# Patient Record
Sex: Female | Born: 1965 | Race: Black or African American | Hispanic: No | Marital: Single | Smoking: Current every day smoker
Health system: Southern US, Community
[De-identification: ages and names within clinical notes are randomized; demographics above are authoritative.]

## PROBLEM LIST (undated history)

## (undated) ENCOUNTER — Ambulatory Visit: Admission: EM | Payer: Managed Care, Other (non HMO) | Source: Home / Self Care

## (undated) DIAGNOSIS — I10 Essential (primary) hypertension: Secondary | ICD-10-CM

## (undated) DIAGNOSIS — Z720 Tobacco use: Secondary | ICD-10-CM

## (undated) DIAGNOSIS — K219 Gastro-esophageal reflux disease without esophagitis: Secondary | ICD-10-CM

## (undated) HISTORY — PX: INDUCED ABORTION: SHX677

---

## 2011-10-28 ENCOUNTER — Other Ambulatory Visit: Payer: Self-pay | Admitting: Occupational Medicine

## 2011-10-28 ENCOUNTER — Ambulatory Visit: Payer: Self-pay

## 2011-10-28 DIAGNOSIS — R52 Pain, unspecified: Secondary | ICD-10-CM

## 2012-10-19 ENCOUNTER — Other Ambulatory Visit: Payer: Self-pay | Admitting: Internal Medicine

## 2012-10-19 DIAGNOSIS — Z1231 Encounter for screening mammogram for malignant neoplasm of breast: Secondary | ICD-10-CM

## 2012-11-26 ENCOUNTER — Ambulatory Visit: Payer: Self-pay

## 2012-12-26 ENCOUNTER — Emergency Department (HOSPITAL_COMMUNITY)
Admission: EM | Admit: 2012-12-26 | Discharge: 2012-12-26 | Disposition: A | Discharge status: Home / Self Care | Payer: 59 | Attending: Emergency Medicine | Admitting: Emergency Medicine

## 2012-12-26 ENCOUNTER — Encounter (HOSPITAL_COMMUNITY): Payer: Self-pay | Admitting: Emergency Medicine

## 2012-12-26 ENCOUNTER — Emergency Department (HOSPITAL_COMMUNITY): Payer: 59

## 2012-12-26 DIAGNOSIS — N644 Mastodynia: Secondary | ICD-10-CM | POA: Insufficient documentation

## 2012-12-26 DIAGNOSIS — O200 Threatened abortion: Secondary | ICD-10-CM

## 2012-12-26 DIAGNOSIS — I10 Essential (primary) hypertension: Secondary | ICD-10-CM | POA: Insufficient documentation

## 2012-12-26 DIAGNOSIS — R42 Dizziness and giddiness: Secondary | ICD-10-CM | POA: Insufficient documentation

## 2012-12-26 DIAGNOSIS — Z79899 Other long term (current) drug therapy: Secondary | ICD-10-CM | POA: Insufficient documentation

## 2012-12-26 DIAGNOSIS — Z3202 Encounter for pregnancy test, result negative: Secondary | ICD-10-CM | POA: Insufficient documentation

## 2012-12-26 DIAGNOSIS — R109 Unspecified abdominal pain: Secondary | ICD-10-CM | POA: Insufficient documentation

## 2012-12-26 HISTORY — DX: Essential (primary) hypertension: I10

## 2012-12-26 LAB — BASIC METABOLIC PANEL
BUN: 13 mg/dL (ref 6–23)
CO2: 26 mEq/L (ref 19–32)
Chloride: 100 mEq/L (ref 96–112)
Creatinine, Ser: 0.69 mg/dL (ref 0.50–1.10)
GFR calc Af Amer: >90 mL/min (ref >90)
Glucose, Bld: 78 mg/dL (ref 70–99)
Potassium: 3.6 mEq/L (ref 3.5–5.1)

## 2012-12-26 LAB — CBC
HCT: 39 % (ref 36.0–46.0)
Hemoglobin: 13.3 g/dL (ref 12.0–15.0)
MCH: 32.8 pg (ref 26.0–34.0)
MCHC: 34.1 g/dL (ref 30.0–36.0)
MCV: 96.3 fL (ref 78.0–100.0)

## 2012-12-26 MED ORDER — OXYCODONE-ACETAMINOPHEN 5-325 MG PO TABS: 1.0000 | ORAL_TABLET | ORAL | Status: DC | PRN

## 2012-12-26 MED ORDER — OXYCODONE-ACETAMINOPHEN 5-325 MG PO TABS
1.0000 | ORAL_TABLET | Freq: Once | ORAL | Status: AC
  Administered 2012-12-26: 1 via ORAL
  Filled 2012-12-26: qty 1

## 2012-12-26 NOTE — ED Notes (Signed)
Report awaked with  bright vaginal bleeding with blood clots. Abdominal cramps all day yesterday. Rates pain 10/10 now 8/10. Felt like she was going to faint applied cool towels to face.

## 2012-12-26 NOTE — ED Notes (Signed)
RUE:[id number]<JW> Expected date:12/26/12<BR> Expected time: 8:40 AM<BR> Means of arrival:Ambulance<BR> Comments:<BR> Vag bleed

## 2012-12-26 NOTE — ED Notes (Signed)
Portable  Ultrasound @ bedside.

## 2012-12-26 NOTE — ED Notes (Signed)
After ambulating from bathroom, patient is not tachycardia.

## 2012-12-26 NOTE — ED Provider Notes (Signed)
History     CSN: [id number]  Arrival date & time 12/26/12  7829   First MD Initiated Contact with Patient 12/26/12 [phone]      Chief Complaint  Patient presents with  . Vaginal Bleeding    (Consider location/radiation/quality/duration/timing/severity/associated sxs/prior treatment) Patient is a 47 y.o. female presenting with vaginal bleeding. The history is provided by the patient.  Vaginal Bleeding This is a new problem. The current episode started today. Associated symptoms include abdominal pain. Pertinent negatives include no chills, fever, nausea or vomiting. Associated symptoms comments: She woke this morning with heavy vaginal bleeding, passing clots and having lower abdominal cramping. No N, V. She reports being concerned about miscarriage although pregnancy was not confirmed prior to arrival. She states she has had irregular menses last month and breast tenderness. .    Past Medical History  Diagnosis Date  . Hypertension     No past surgical history on file.  No family history on file.  History  Substance Use Topics  . Smoking status: Not on file  . Smokeless tobacco: Not on file  . Alcohol Use: Yes     Comment: mix drink social    OB History    Grav Para Term Preterm Abortions TAB SAB Ect Mult Living                  Review of Systems  Constitutional: Negative for fever and chills.  Respiratory: Negative.   Cardiovascular: Negative.   Gastrointestinal: Positive for abdominal pain. Negative for nausea and vomiting.  Genitourinary: Positive for vaginal bleeding. Negative for dysuria and vaginal discharge.  Musculoskeletal: Negative.   Skin: Negative.   Neurological: Positive for dizziness.    Allergies  Review of patient's allergies indicates no known allergies.  Home Medications   Current Outpatient Rx  Name  Route  Sig  Dispense  Refill  . ATENOLOL 25 MG PO TABS   Oral   Take 25 mg by mouth daily.         Marland Kitchen HYDROCHLOROTHIAZIDE 25 MG PO TABS   Oral   Take 25 mg by mouth daily.           BP 102/59  Pulse 69  Temp 98.1 F (36.7 C) (Oral)  Resp 20  SpO2 100%  LMP 12/09/2012  Physical Exam  Constitutional: She is oriented to person, place, and time. She appears well-developed and well-nourished.  HENT:  Head: Normocephalic.  Neck: Normal range of motion. Neck supple.  Cardiovascular: Normal rate and regular rhythm.   Pulmonary/Chest: Effort normal and breath sounds normal.  Abdominal: Soft. Bowel sounds are normal. There is no tenderness. There is no rebound and no guarding.  Genitourinary: Vagina normal and uterus normal. No vaginal discharge found.       Significant cervical bleeding with clotting filling vaginal vault. No visualized POC. Uterine tenderness without palpated enlargement, however, exam limited by body habitus.  No adnexal mass or tenderness.  Musculoskeletal: Normal range of motion.  Neurological: She is alert and oriented to person, place, and time.  Skin: Skin is warm and dry. No rash noted.  Psychiatric: She has a normal mood and affect.    ED Course  Procedures (including critical care time)  Labs Reviewed  CBC - Abnormal; Notable for the following:    WBC 11.3 (*)     All other components within normal limits  PREGNANCY, URINE - Abnormal; Notable for the following:    Preg Test, Ur POSITIVE (*)  All other components within normal limits  HCG, QUANTITATIVE, PREGNANCY - Abnormal; Notable for the following:    hCG, Beta Chain, Quant, S 3641 (*)     All other components within normal limits  ABO/RH  BASIC METABOLIC PANEL   Results for orders placed during the hospital encounter of 12/26/12  CBC      Component Value Range   WBC 11.3 (*) 4.0 - 10.5 K/uL   RBC 4.05  3.87 - 5.11 MIL/uL   Hemoglobin 13.3  12.0 - 15.0 g/dL   HCT 16.1  09.6 - 04.5 %   MCV 96.3  78.0 - 100.0 fL   MCH 32.8  26.0 - 34.0 pg   MCHC 34.1  30.0 - 36.0 g/dL   RDW 40.9  81.1 - 91.4 %   Platelets 197  150 - 400  K/uL  PREGNANCY, URINE      Component Value Range   Preg Test, Ur POSITIVE (*) NEGATIVE  ABO/RH      Component Value Range   ABO/RH(D) O POS    HCG, QUANTITATIVE, PREGNANCY      Component Value Range   hCG, Beta Chain, Quant, S 3641 (*) <5 mIU/mL  BASIC METABOLIC PANEL      Component Value Range   Sodium 136  135 - 145 mEq/L   Potassium 3.6  3.5 - 5.1 mEq/L   Chloride 100  96 - 112 mEq/L   CO2 26  19 - 32 mEq/L   Glucose, Bld 78  70 - 99 mg/dL   BUN 13  6 - 23 mg/dL   Creatinine, Ser 7.82  0.50 - 1.10 mg/dL   Calcium 9.4  8.4 - 95.6 mg/dL   GFR calc non Af Amer >90  >90 mL/min   GFR calc Af Amer >90  >90 mL/min   US Ob Comp Less 14 Wks  12/26/2012  *RADIOLOGY REPORT*  Clinical Data: Positive pregnancy test, vaginal bleeding, quantitative beta HCG 3641  OBSTETRIC <14 WK [place] AND TRANSVAGINAL OB US  Technique:  Both transabdominal and transvaginal ultrasound examinations were performed for complete evaluation of the gestation as well as the maternal uterus, adnexal regions, and pelvic cul-de-sac.  Transvaginal technique was performed to assess early pregnancy.  Comparison:  None.  Intrauterine gestational sac:  Not visualized Yolk sac: Not visualized Embryo: Not visualized Cardiac Activity: Not visualized  Maternal uterus/adnexae: Endometrium is inhomogeneously echogenic measuring 1.7 cm.  Ovaries are normal.  On real time exam, mobile fluid/debris was noted within the upper vagina.  IMPRESSION: No intrauterine gestational sac, yolk sac, fetal pole, or cardiac activity visualized. Differential considerations include intrauterine gestation too early to be sonographically visualized, spontaneous abortion, or ectopic pregnancy.  Consider follow-up ultrasound in 10-14 days and serial quantitative beta HCG follow- up.   Original Report Authenticated By: Christiana Pellant, M.D.    US Ob Transvaginal  12/26/2012  *RADIOLOGY REPORT*  Clinical Data: Positive pregnancy test, vaginal bleeding, quantitative  beta HCG 3641  OBSTETRIC <14 WK [place] AND TRANSVAGINAL OB US  Technique:  Both transabdominal and transvaginal ultrasound examinations were performed for complete evaluation of the gestation as well as the maternal uterus, adnexal regions, and pelvic cul-de-sac.  Transvaginal technique was performed to assess early pregnancy.  Comparison:  None.  Intrauterine gestational sac:  Not visualized Yolk sac: Not visualized Embryo: Not visualized Cardiac Activity: Not visualized  Maternal uterus/adnexae: Endometrium is inhomogeneously echogenic measuring 1.7 cm.  Ovaries are normal.  On real time exam, mobile fluid/debris was noted  within the upper vagina.  IMPRESSION: No intrauterine gestational sac, yolk sac, fetal pole, or cardiac activity visualized. Differential considerations include intrauterine gestation too early to be sonographically visualized, spontaneous abortion, or ectopic pregnancy.  Consider follow-up ultrasound in 10-14 days and serial quantitative beta HCG follow- up.   Original Report Authenticated By: Christiana Pellant, M.D.    No results found.   No diagnosis found. 1. Miscarriage   MDM  Pain has been managed. Discussed with Dr. Marice Potter at [facility] who advises 48 hour recheck at [facility] MAU and that the clinic will contact her for follow up.         Arnoldo Hooker, PA-C 12/26/12 1415

## 2012-12-28 ENCOUNTER — Encounter (HOSPITAL_COMMUNITY): Payer: Self-pay

## 2012-12-28 ENCOUNTER — Inpatient Hospital Stay (HOSPITAL_COMMUNITY)
Admission: AD | Admit: 2012-12-28 | Discharge: 2012-12-28 | Disposition: A | Discharge status: Home / Self Care | Payer: 59 | Source: Ambulatory Visit | Attending: Obstetrics & Gynecology | Admitting: Obstetrics & Gynecology

## 2012-12-28 DIAGNOSIS — O039 Complete or unspecified spontaneous abortion without complication: Secondary | ICD-10-CM

## 2012-12-28 NOTE — MAU Provider Note (Signed)
History     CSN: [id number]  Arrival date and time: 12/28/12 4098   None     Chief Complaint  Patient presents with  . Follow-up   HPI 47 y.o. [id number] at [redacted]w[redacted]d by LMP. Pt seen at [facility] on 1/4 with heavy vaginal bleeding and + UPT. Quant was 3400 and no IUP or adnexal mass were seen on U/S at that time. Pt instructed to come to MAU for f/u quant HCG. Continues bleeding like a period and mild cramping.    Past Medical History  Diagnosis Date  . Hypertension     History reviewed. No pertinent past surgical history.  No family history on file.  History  Substance Use Topics  . Smoking status: Not on file  . Smokeless tobacco: Not on file  . Alcohol Use: Yes     Comment: mix drink social    Allergies: No Known Allergies  Prescriptions prior to admission  Medication Sig Dispense Refill  . atenolol (TENORMIN) 25 MG tablet Take 25 mg by mouth daily.      . hydrochlorothiazide (HYDRODIURIL) 25 MG tablet Take 25 mg by mouth daily.      Marland Kitchen oxyCODONE-acetaminophen (PERCOCET/ROXICET) 5-325 MG per tablet Take 1 tablet by mouth every 4 (four) hours as needed for pain.  15 tablet  0    Review of Systems  Constitutional: Negative.   Respiratory: Negative.   Cardiovascular: Negative.   Gastrointestinal: Negative for nausea, vomiting, abdominal pain, diarrhea and constipation.  Genitourinary: Negative for dysuria, urgency, frequency, hematuria and flank pain.       Positive for vaginal bleeding   Musculoskeletal: Negative.   Neurological: Negative.   Psychiatric/Behavioral: Negative.    Physical Exam   Blood pressure 122/68, pulse 79, temperature 99.5 F (37.5 C), temperature source Oral, resp. rate 16, height 5\' 6"  (1.676 m), weight 208 lb 9.6 oz (94.62 kg), last menstrual period 11/09/2012, SpO2 100.00%.  Physical Exam  Nursing note and vitals reviewed. Constitutional: She is oriented to person, place, and time. She appears well-developed and well-nourished. No distress.    Cardiovascular: Normal rate.   Respiratory: Effort normal.  Musculoskeletal: Normal range of motion.  Neurological: She is alert and oriented to person, place, and time.  Skin: Skin is warm and dry.  Psychiatric: She has a normal mood and affect.    MAU Course  Procedures Results for orders placed during the hospital encounter of 12/28/12 (from the past 24 hour(s))  HCG, QUANTITATIVE, PREGNANCY     Status: Abnormal   Collection Time   12/28/12  9:33 AM      Component Value Range   hCG, Beta Chain, Quant, S 486 (*) <5 mIU/mL     Assessment and Plan   1. Miscarriage   appropriate drop in quant c/w miscarriage, precautions rev'd, note given to excuse from work this week per patient request    Medication List     As of 12/28/2012 11:39 AM    CONTINUE taking these medications         atenolol 25 MG tablet   Commonly known as: TENORMIN      hydrochlorothiazide 25 MG tablet   Commonly known as: HYDRODIURIL      oxyCODONE-acetaminophen 5-325 MG per tablet   Commonly known as: PERCOCET/ROXICET   Take 1 tablet by mouth every 4 (four) hours as needed for pain.            Follow-up Information    Follow up with [facility]. In  2 weeks. (for repeat labs)    Contact information:   [address] [place] [address] [phone]           Hridaan Bouse 12/28/2012, 9:34 AM

## 2012-12-28 NOTE — ED Provider Notes (Signed)
Medical screening examination/treatment/procedure(s) were performed by non-physician practitioner and as supervising physician I was immediately available for consultation/collaboration.  Juliet Rude. Rubin Payor, MD 12/28/12 2317

## 2012-12-28 NOTE — MAU Note (Signed)
Patient states she was seen at [facility] on 1-4 and evaluated. Instructed to return to MAU today for repeat BHCG. Patient states she continues to have bleeding like a period and having mild cramping.

## 2012-12-29 ENCOUNTER — Encounter: Payer: Self-pay | Admitting: Obstetrics and Gynecology

## 2013-01-14 ENCOUNTER — Ambulatory Visit (INDEPENDENT_AMBULATORY_CARE_PROVIDER_SITE_OTHER): Payer: 59 | Admitting: Obstetrics and Gynecology

## 2013-01-14 ENCOUNTER — Other Ambulatory Visit: Payer: 59

## 2013-01-14 DIAGNOSIS — O039 Complete or unspecified spontaneous abortion without complication: Secondary | ICD-10-CM

## 2013-01-16 NOTE — Progress Notes (Signed)
   Complete physical exam  Patient: Debbie Ball   DOB: 10/12/1997   47 y.o. Female  MRN: [id number]  Subjective:    No chief complaint on file.   Debbie Ball is a 47 y.o. female who presents today for a complete physical exam. She reports consuming a {diet types:17450} diet. {types:19826} She generally feels {DESC; WELL/FAIRLY WELL/POORLY:18703}. She reports sleeping {DESC; WELL/FAIRLY WELL/POORLY:18703}. She {does/does not:200015} have additional problems to discuss today.    Most recent fall risk assessment:    06/19/2022   10:42 AM  Fall Risk   Falls in the past year? 0  Number falls in past yr: 0  Injury with Fall? 0  Risk for fall due to : No Fall Risks  Follow up Falls evaluation completed     Most recent depression screenings:    06/19/2022   10:42 AM 05/10/2021   10:46 AM  PHQ 2/9 Scores  PHQ - 2 Score 0 0  PHQ- 9 Score 5     {VISON DENTAL STD PSA (Optional):27386}  {History (Optional):23778}  Patient Care Team: Jessup, Joy, NP as PCP - General (Nurse Practitioner)   Outpatient Medications Prior to Visit  Medication Sig   fluticasone (FLONASE) 50 MCG/ACT nasal spray Place 2 sprays into both nostrils in the morning and at bedtime. After 7 days, reduce to once daily.   norgestimate-ethinyl estradiol (SPRINTEC 28) 0.25-35 MG-MCG tablet Take 1 tablet by mouth daily.   Nystatin POWD Apply liberally to affected area 2 times per day   spironolactone (ALDACTONE) 100 MG tablet Take 1 tablet (100 mg total) by mouth daily.   No facility-administered medications prior to visit.    ROS        Objective:     There were no vitals taken for this visit. {Vitals History (Optional):23777}  Physical Exam   No results found for any visits on 07/25/22. {Show previous labs (optional):23779}    Assessment & Plan:    Routine Health Maintenance and Physical Exam  Immunization History  Administered Date(s) Administered   DTaP 12/26/1999, 02/21/2000,  05/01/2000, 01/15/2001, 07/31/2004   Hepatitis A 05/27/2008, 06/02/2009   Hepatitis B 10/13/1999, 11/20/1999, 05/01/2000   HiB (PRP-OMP) 12/26/1999, 02/21/2000, 05/01/2000, 01/15/2001   IPV 12/26/1999, 02/21/2000, 10/20/2000, 07/31/2004   Influenza,inj,Quad PF,6+ Mos 09/02/2014   Influenza-Unspecified 12/02/2012   MMR 10/20/2001, 07/31/2004   Meningococcal Polysaccharide 06/01/2012   Pneumococcal Conjugate-13 01/15/2001   Pneumococcal-Unspecified 05/01/2000, 07/15/2000   Tdap 06/01/2012   Varicella 10/20/2000, 05/27/2008    Health Maintenance  Topic Date Due   HIV Screening  Never done   Hepatitis C Screening  Never done   INFLUENZA VACCINE  07/23/2022   PAP-Cervical Cytology Screening  07/25/2022 (Originally 10/11/2020)   PAP SMEAR-Modifier  07/25/2022 (Originally 10/11/2020)   TETANUS/TDAP  07/25/2022 (Originally 06/01/2022)   HPV VACCINES  Discontinued   COVID-19 Vaccine  Discontinued    Discussed health benefits of physical activity, and encouraged her to engage in regular exercise appropriate for her age and condition.  Problem List Items Addressed This Visit   None Visit Diagnoses     Annual physical exam    -  Primary   Cervical cancer screening       Need for Tdap vaccination          No follow-ups on file.     Joy Jessup, NP   

## 2013-01-27 ENCOUNTER — Ambulatory Visit: Payer: Self-pay | Admitting: Obstetrics & Gynecology

## 2013-02-01 ENCOUNTER — Encounter: Payer: Self-pay | Admitting: *Deleted

## 2013-02-12 ENCOUNTER — Ambulatory Visit: Payer: Self-pay | Admitting: Obstetrics & Gynecology

## 2013-05-21 ENCOUNTER — Emergency Department (HOSPITAL_COMMUNITY)
Admission: EM | Admit: 2013-05-21 | Discharge: 2013-05-21 | Disposition: A | Discharge status: Home / Self Care | Payer: 59 | Source: Home / Self Care

## 2013-05-21 ENCOUNTER — Encounter (HOSPITAL_COMMUNITY): Payer: Self-pay

## 2013-05-21 DIAGNOSIS — L253 Unspecified contact dermatitis due to other chemical products: Secondary | ICD-10-CM

## 2013-05-21 DIAGNOSIS — L0889 Other specified local infections of the skin and subcutaneous tissue: Secondary | ICD-10-CM

## 2013-05-21 MED ORDER — METHYLPREDNISOLONE 4 MG PO KIT: PACK | ORAL | Status: DC

## 2013-05-21 MED ORDER — TRIAMCINOLONE ACETONIDE 40 MG/ML IJ SUSP
INTRAMUSCULAR | Status: AC
  Filled 2013-05-21: qty 5

## 2013-05-21 MED ORDER — TRIAMCINOLONE ACETONIDE 40 MG/ML IJ SUSP
40.0000 mg | Freq: Once | INTRAMUSCULAR | Status: AC
  Administered 2013-05-21: 40 mg via INTRAMUSCULAR

## 2013-05-21 MED ORDER — TRIAMCINOLONE ACETONIDE 0.1 % EX CREA: TOPICAL_CREAM | Freq: Two times a day (BID) | CUTANEOUS | Status: DC

## 2013-05-21 MED ORDER — CEPHALEXIN 500 MG PO CAPS: 500.0000 mg | ORAL_CAPSULE | Freq: Four times a day (QID) | ORAL | Status: DC

## 2013-05-21 NOTE — ED Notes (Signed)
Used hair color product on 5-28 , and since then, she has experienced problems w her scalp; NAD

## 2013-05-21 NOTE — ED Provider Notes (Signed)
History     CSN: [id number]  Arrival date & time 05/21/13  1213   First MD Initiated Contact with Patient 05/21/13 1321      Chief Complaint  Patient presents with  . Allergic Reaction    (Consider location/radiation/quality/duration/timing/severity/associated sxs/prior treatment) HPI Comments: 47 year old female had applied chemicals and denies to her scalp last weekend. Approximately 3-4 days later she developed itching of the scalp and papular lesions to the scalp and along the hairline from the front to the back. She has not washed her hair yet. There are a couple of lesions which are draining and quite sore.   Past Medical History  Diagnosis Date  . Hypertension     Past Surgical History  Procedure Laterality Date  . Induced abortion      Family History  Problem Relation Age of Onset  . Other Neg Hx     History  Substance Use Topics  . Smoking status: Light Tobacco Smoker -- 0.50 packs/day for 15 years  . Smokeless tobacco: Not on file  . Alcohol Use: Yes     Comment: mix drink social    OB History   Grav Para Term Preterm Abortions TAB SAB Ect Mult Living   4 2 2  1 1    2       Review of Systems  Skin:       As per history of present illness  All other systems reviewed and are negative.    Allergies  Review of patient's allergies indicates no known allergies.  Home Medications   Current Outpatient Rx  Name  Route  Sig  Dispense  Refill  . atenolol (TENORMIN) 25 MG tablet   Oral   Take 25 mg by mouth daily.         . hydrochlorothiazide (HYDRODIURIL) 25 MG tablet   Oral   Take 25 mg by mouth daily.         . cephALEXin (KEFLEX) 500 MG capsule   Oral   Take 1 capsule (500 mg total) by mouth 4 (four) times daily.   28 capsule   0   . methylPREDNISolone (MEDROL, PAK,) 4 MG tablet      follow package directions. Start 05/22/13   21 tablet   0   . oxyCODONE-acetaminophen (PERCOCET/ROXICET) 5-325 MG per tablet   Oral   Take 1 tablet  by mouth every 4 (four) hours as needed for pain.   15 tablet   0   . triamcinolone cream (KENALOG) 0.1 %   Topical   Apply topically 2 (two) times daily. Apply for 2 weeks. May use on face   30 g   0     BP 171/100  Pulse 70  Temp(Src) 98.8 F (37.1 C) (Oral)  Resp 18  SpO2 100%  LMP 05/18/2013  Breastfeeding? Unknown  Physical Exam  Nursing note and vitals reviewed. Constitutional: She is oriented to person, place, and time. She appears well-developed and well-nourished. No distress.  HENT:  Right Ear: External ear normal.  Left Ear: External ear normal.  Mouth/Throat: Oropharynx is clear and moist. No oropharyngeal exudate.  Eyes: Conjunctivae and EOM are normal.  Neck: Normal range of motion. Neck supple.  Cardiovascular: Normal rate and normal heart sounds.   Pulmonary/Chest: Effort normal and breath sounds normal. No respiratory distress.  Musculoskeletal: Normal range of motion. She exhibits no edema.  Lymphadenopathy:  There are several occipital and posterior cervical enlarged lymph nodes.  Neurological: She is alert and oriented  to person, place, and time.  Skin: Skin is warm and dry. Rash noted.  Psychiatric: She has a normal mood and affect.    ED Course  Procedures (including critical care time)  Labs Reviewed - No data to display No results found.   1. Contact dermatitis due to chemicals   2. Secondary infection of skin       MDM  Wash her hair with Johnson's baby shampoo in irrigate copiously in the shower. Apply triamcinolone cream but smaller water to the base of the neck and edges of the hairline. I also use a small amount of cream to place and water stirred to create lotion to place on the scalp once or twice a day. Keflex 500 mg 4 times a day for 7 days Kenalog 40 mg IM  Medrol Dosepak start tomorrow.   Hayden Rasmussen, NP 05/21/13 1411   Hayden Rasmussen, NP 05/21/13 1414

## 2013-05-22 NOTE — ED Provider Notes (Signed)
Medical screening examination/treatment/procedure(s) were performed by resident physician or non-physician practitioner and as supervising physician I was immediately available for consultation/collaboration.   Barkley Bruns MD.   Linna Hoff, MD 05/22/13 [phone]

## 2013-11-23 ENCOUNTER — Emergency Department (HOSPITAL_COMMUNITY)
Admission: EM | Admit: 2013-11-23 | Discharge: 2013-11-23 | Disposition: A | Discharge status: Home / Self Care | Payer: Managed Care, Other (non HMO) | Source: Home / Self Care | Attending: Family Medicine | Admitting: Family Medicine

## 2013-11-23 ENCOUNTER — Encounter (HOSPITAL_COMMUNITY): Payer: Self-pay | Admitting: Emergency Medicine

## 2013-11-23 DIAGNOSIS — J069 Acute upper respiratory infection, unspecified: Secondary | ICD-10-CM

## 2013-11-23 MED ORDER — IPRATROPIUM BROMIDE 0.06 % NA SOLN: 2.0000 | Freq: Four times a day (QID) | NASAL | Status: DC

## 2013-11-23 MED ORDER — GUAIFENESIN-CODEINE 100-10 MG/5ML PO SYRP: 5.0000 mL | ORAL_SOLUTION | Freq: Three times a day (TID) | ORAL | Status: DC | PRN

## 2013-11-23 MED ORDER — AMOXICILLIN 500 MG PO CAPS: 1000.0000 mg | ORAL_CAPSULE | Freq: Two times a day (BID) | ORAL | Status: DC

## 2013-11-23 MED ORDER — METHYLPREDNISOLONE 4 MG PO KIT: PACK | ORAL | Status: DC

## 2013-11-23 NOTE — ED Provider Notes (Signed)
Debbie Ball is a 47 y.o. female who presents to Urgent Care today for sore throat headache left-sided ear pain cough congestion present for 2 days. Patient notes that the cough is mildly productive. She denies any chest pain shortness of breath nausea vomiting or diarrhea. The cough is keeping her up at night. She's tried some over-the-counter cough and cold medications which have not been very helpful. No nausea vomiting or diarrhea.   Past Medical History  Diagnosis Date  . Hypertension    History  Substance Use Topics  . Smoking status: Light Tobacco Smoker -- 0.50 packs/day for 15 years  . Smokeless tobacco: Not on file  . Alcohol Use: Yes     Comment: mix drink social   ROS as above Medications reviewed. No current facility-administered medications for this encounter.   Current Outpatient Prescriptions  Medication Sig Dispense Refill  . amoxicillin (AMOXIL) 500 MG capsule Take 2 capsules (1,000 mg total) by mouth 2 (two) times daily.  28 capsule  0  . atenolol (TENORMIN) 25 MG tablet Take 25 mg by mouth daily.      Marland Kitchen guaiFENesin-codeine (VIRTUSSIN A/C) 100-10 MG/5ML syrup Take 5 mLs by mouth 3 (three) times daily as needed for cough.  120 mL  0  . hydrochlorothiazide (HYDRODIURIL) 25 MG tablet Take 25 mg by mouth daily.      Marland Kitchen ipratropium (ATROVENT) 0.06 % nasal spray Place 2 sprays into both nostrils 4 (four) times daily.  15 mL  1  . methylPREDNISolone (MEDROL, PAK,) 4 MG tablet follow package directions. Start 05/22/13  21 tablet  0    Exam:  BP 153/89  Pulse 84  Temp(Src) 99.2 F (37.3 C) (Oral)  Resp 16  SpO2 100%  LMP 11/19/2013 Gen: Well NAD HEENT: EOMI,  MMM, posterior pharynx is mildly erythematous without exudate. Tympanic membranes are normal appearing bilaterally. Tender left-sided anterior cervical lymphadenopathy present. Lungs: Normal work of breathing. CTABL Heart: RRR no MRG Abd: NABS, Soft. NT, ND Exts: Non edematous BL  LE, warm and well perfused.    No results found for this or any previous visit (from the past 24 hour(s)). No results found.  Assessment and Plan: 47 y.o. female with viral URI. Plan for symptomatic treatment with Medrol Dosepak, Atrovent nasal spray, codeine containing cough medication. Additionally use amoxicillin if patient is not improved. Followup with primary care provider.  Discussed warning signs or symptoms. Please see discharge instructions. Patient expresses understanding.      Rodolph Bong, MD 11/23/13 1330

## 2013-11-23 NOTE — ED Notes (Signed)
Sore throat, left ear pain, and headache onset sunday

## 2014-03-07 IMAGING — US US OB COMP LESS 14 WK
1 series · 14 of 28 positions shown · non-contrast
Comparison: None.

CLINICAL DATA: Positive pregnancy test, vaginal bleeding,
quantitative beta HCG 9769

OBSTETRIC <14 WK US AND TRANSVAGINAL OB US
TECHNIQUE: Both transabdominal and transvaginal ultrasound
examinations were performed for complete evaluation of the
gestation as well as the maternal uterus, adnexal regions, and
pelvic cul-de-sac.  Transvaginal technique was performed to assess
early pregnancy.

[Series 1: us ob comp less 14 wk · 0.28mm/px · 36 acquisitions, 14 frames shown]
[im 2/36]
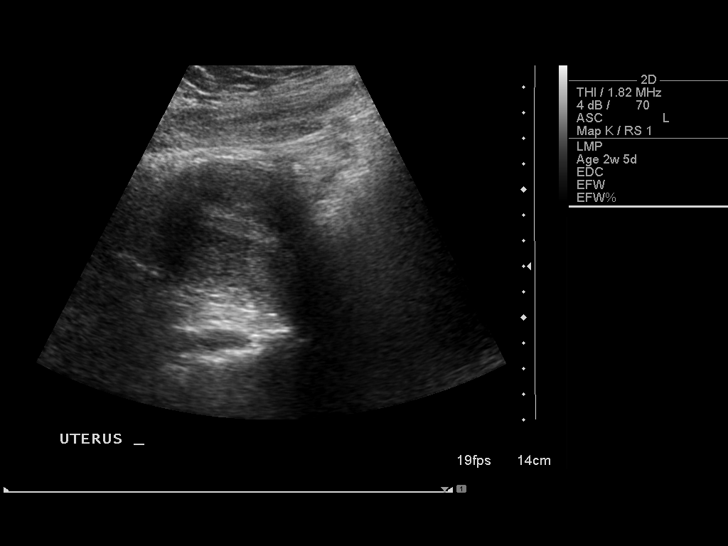
[im 4/36]
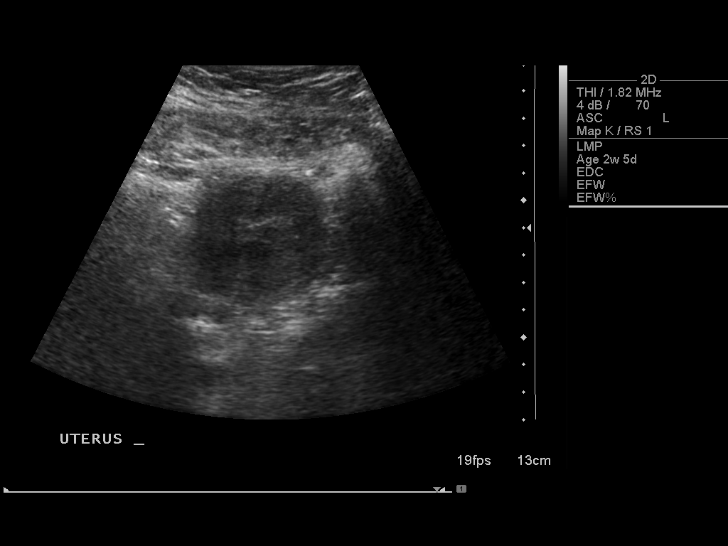
[im 7/36]
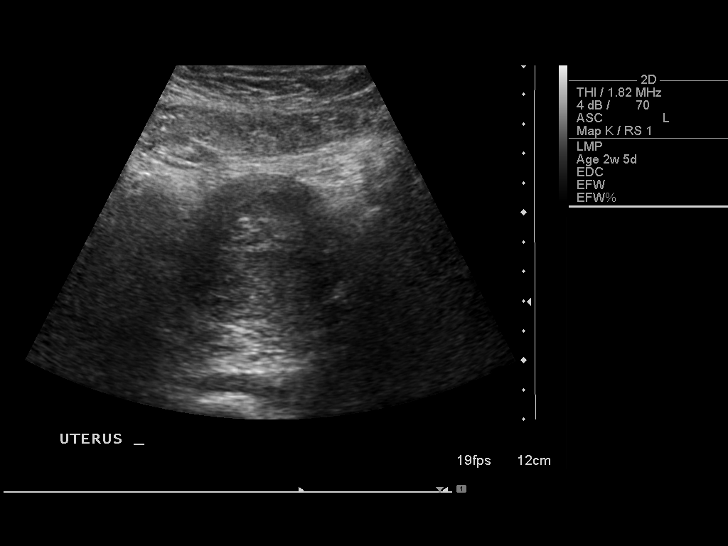
[im 10/36]
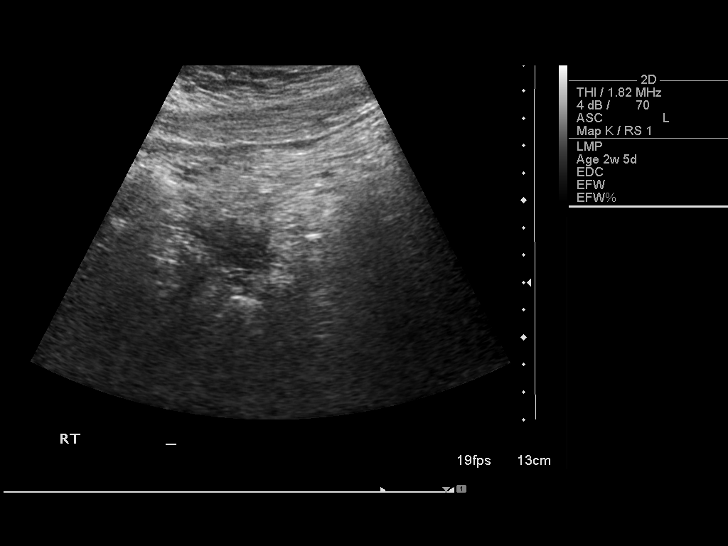
[im 12/36]
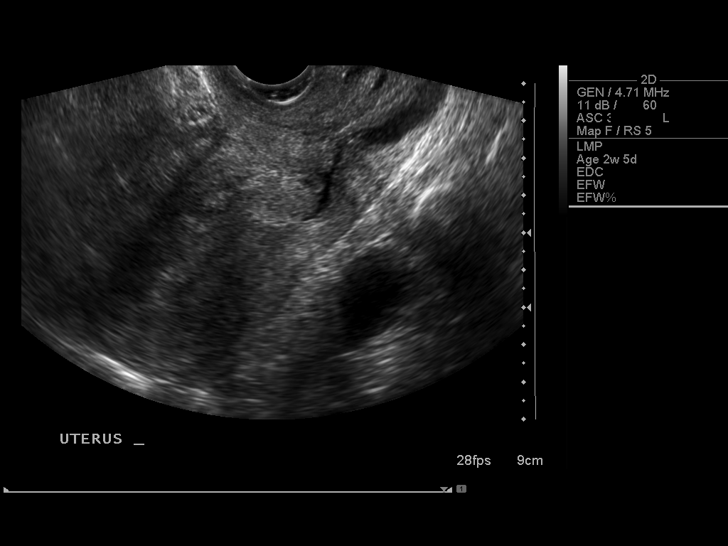
[im 15/36]
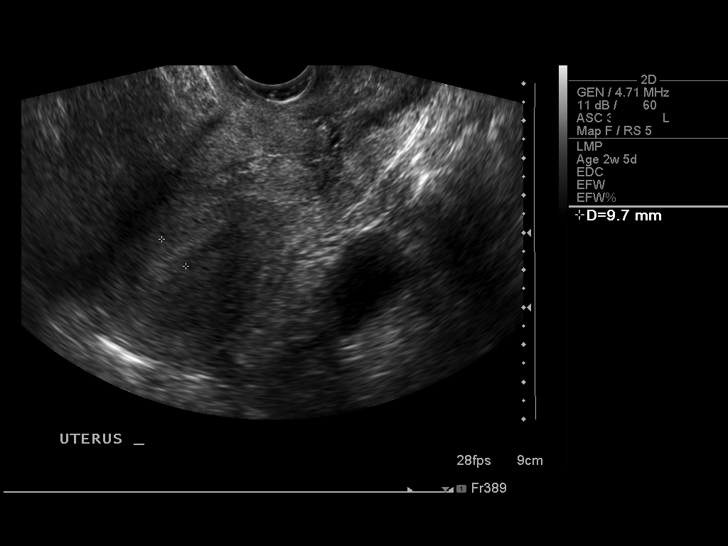
[im 17/36]
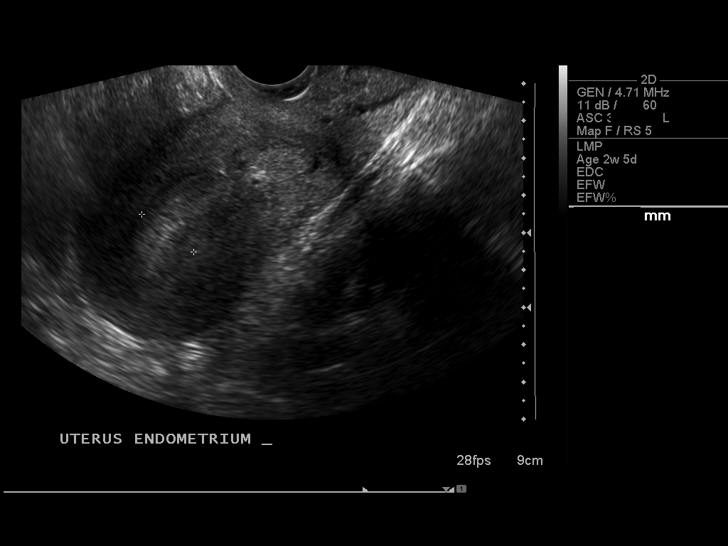
[im 20/36]
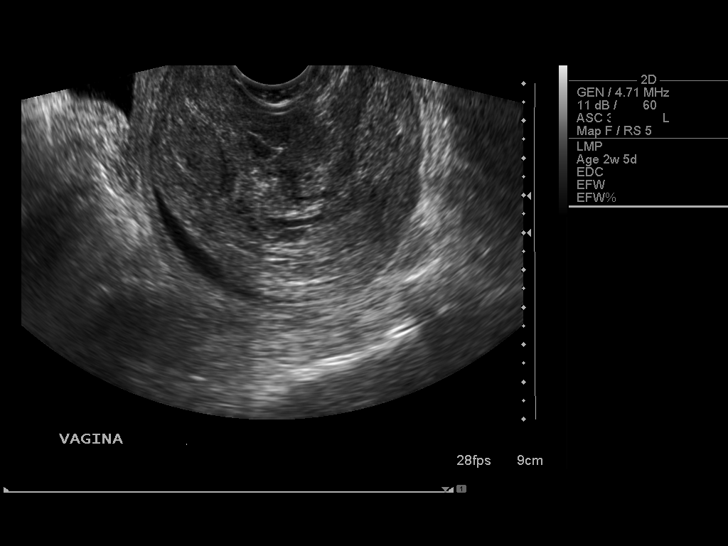
[im 23/36]
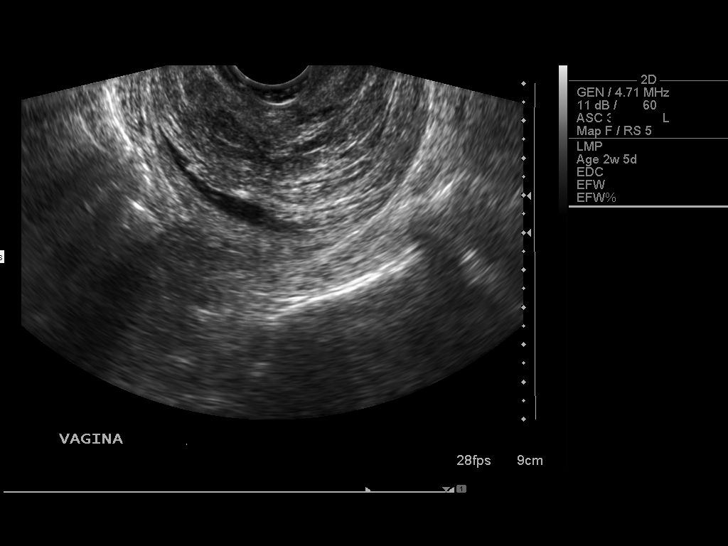
[im 25/36]
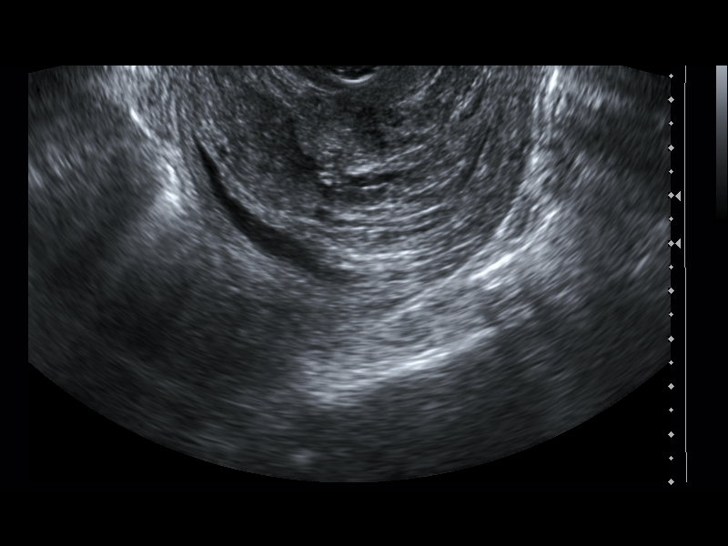
[im 28/36]
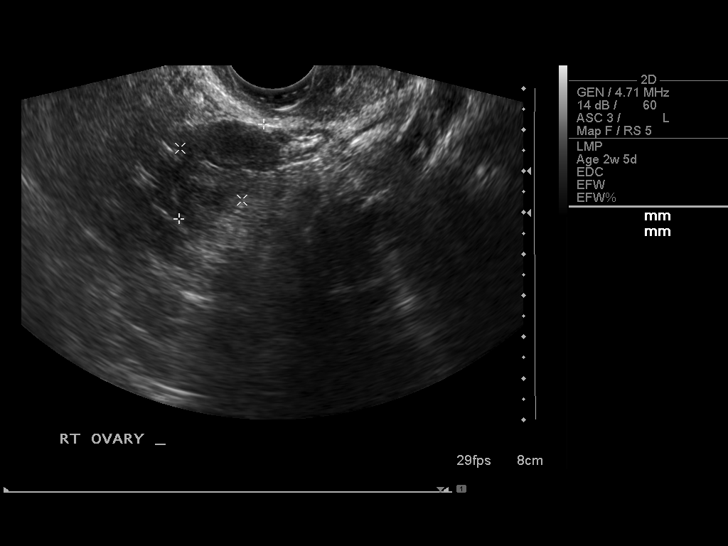
[im 30/36]
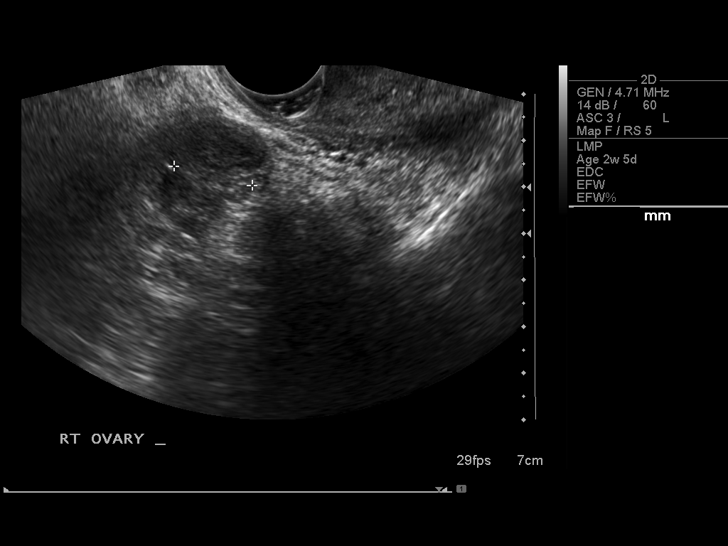
[im 33/36]
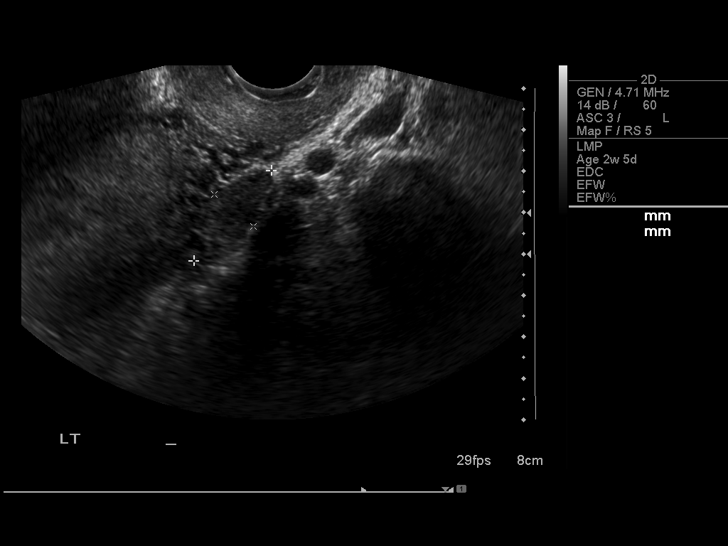
[im 36/36]
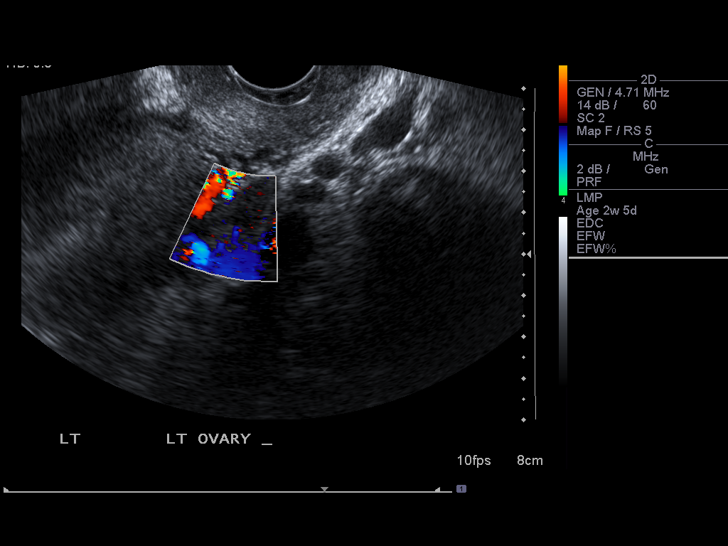

[14 of 28 positions shown; findings below may reference images not displayed]

Intrauterine gestational sac:  Not visualized
Yolk sac: Not visualized
Embryo: Not visualized
Cardiac Activity: Not visualized

Maternal uterus/adnexae:
Endometrium is inhomogeneously echogenic measuring 1.7 cm.  Ovaries
are normal.  On real time exam, mobile fluid/debris was noted
within the upper vagina.
IMPRESSION: No intrauterine gestational sac, yolk sac, fetal pole, or cardiac
activity visualized. Differential considerations include
intrauterine gestation too early to be sonographically visualized,
spontaneous abortion, or ectopic pregnancy.  Consider follow-up
ultrasound in 10-14 days and serial quantitative beta HCG follow-
up.

## 2014-09-29 ENCOUNTER — Emergency Department (HOSPITAL_COMMUNITY)
Admission: EM | Admit: 2014-09-29 | Discharge: 2014-09-29 | Disposition: A | Discharge status: Home / Self Care | Payer: Managed Care, Other (non HMO) | Attending: Emergency Medicine | Admitting: Emergency Medicine

## 2014-09-29 ENCOUNTER — Encounter (HOSPITAL_COMMUNITY): Payer: Self-pay | Admitting: Emergency Medicine

## 2014-09-29 DIAGNOSIS — R220 Localized swelling, mass and lump, head: Secondary | ICD-10-CM | POA: Diagnosis present

## 2014-09-29 DIAGNOSIS — R21 Rash and other nonspecific skin eruption: Secondary | ICD-10-CM | POA: Insufficient documentation

## 2014-09-29 DIAGNOSIS — Z79899 Other long term (current) drug therapy: Secondary | ICD-10-CM | POA: Insufficient documentation

## 2014-09-29 DIAGNOSIS — K130 Diseases of lips: Secondary | ICD-10-CM | POA: Diagnosis not present

## 2014-09-29 DIAGNOSIS — Z720 Tobacco use: Secondary | ICD-10-CM | POA: Insufficient documentation

## 2014-09-29 DIAGNOSIS — I10 Essential (primary) hypertension: Secondary | ICD-10-CM | POA: Diagnosis not present

## 2014-09-29 DIAGNOSIS — Z792 Long term (current) use of antibiotics: Secondary | ICD-10-CM | POA: Diagnosis not present

## 2014-09-29 MED ORDER — IBUPROFEN 800 MG PO TABS: 800.0000 mg | ORAL_TABLET | Freq: Three times a day (TID) | ORAL | Status: DC

## 2014-09-29 MED ORDER — CEPHALEXIN 500 MG PO CAPS: 1000.0000 mg | ORAL_CAPSULE | Freq: Two times a day (BID) | ORAL | Status: DC

## 2014-09-29 NOTE — Discharge Instructions (Signed)
Your lip infection may originate from a herpes blister.  Buy over the counter Zovirax and apply to rash as directed. Take antibiotic as treatment.    Facial Infection You have an infection of your face. This requires special attention to help prevent serious problems. Infections in facial wounds can cause poor healing and scars. They can also spread to deeper tissues, especially around the eye. Wound and dental infections can lead to sinusitis, infection of the eye socket, and even meningitis. Permanent damage to the skin, eye, and nervous system may result if facial infections are not treated properly. With severe infections, hospital care for IV antibiotic injections may be needed if they don't respond to oral antibiotics. Antibiotics must be taken for the full course to insure the infection is eliminated. If the infection came from a bad tooth, it may have to be extracted when the infection is under control. Warm compresses may be applied to reduce skin irritation and remove drainage. You might need a tetanus shot now if:  You cannot remember when your last tetanus shot was.  You have never had a tetanus shot.  The object that caused your wound was dirty. If you need a tetanus shot, and you decide not to get one, there is a rare chance of getting tetanus. Sickness from tetanus can be serious. If you got a tetanus shot, your arm may swell, get red and warm to the touch at the shot site. This is common and not a problem. SEEK IMMEDIATE MEDICAL CARE IF:   You have increased swelling, redness, or trouble breathing.  You have a severe headache, dizziness, nausea, or vomiting.  You develop problems with your eyesight.  You have a fever. Document Released: 01/16/2005 Document Revised: 03/02/2012 Document Reviewed: 12/09/2005 [facility] Patient Information 2015 [place], [place]. This information is not intended to replace advice given to you by your health care provider. Make sure you discuss any  questions you have with your health care provider.

## 2014-09-29 NOTE — ED Provider Notes (Signed)
CSN: [id number]     Arrival date & time 09/29/14  [id number] History   First MD Initiated Contact with Patient 09/29/14 [phone]     Chief Complaint  Patient presents with  . Oral Swelling     (Consider location/radiation/quality/duration/timing/severity/associated sxs/prior Treatment) HPI  48 year old female who presents complaining of right upper lip infection. Patient states she noticed a cold sore to her upper lip approximately 5 days ago. It has gotten progressively larger in size and became more painful and swollen. She tries over-the-counter peroxide drops 2 days ago but states symptoms get worse. She notices discharge oozing from the wound. No associated fever, trouble swallowing, dental pain, trouble open her jaw, neck pain, nausea vomiting or diarrhea. No one else at home with similar symptoms. No prior history of cold sores. No history of immunocompromise. UTD with tetanus.  Past Medical History  Diagnosis Date  . Hypertension    Past Surgical History  Procedure Laterality Date  . Induced abortion     Family History  Problem Relation Age of Onset  . Other Neg Hx    History  Substance Use Topics  . Smoking status: Current Every Day Smoker -- 0.50 packs/day for 15 years  . Smokeless tobacco: Not on file  . Alcohol Use: Yes     Comment: mix drink social   OB History   Grav Para Term Preterm Abortions TAB SAB Ect Mult Living   4 2 2  1 1    2      Review of Systems  Constitutional: Negative for fever.  HENT: Positive for facial swelling. Negative for trouble swallowing and voice change.   Skin: Negative for rash.      Allergies  Review of patient's allergies indicates no known allergies.  Home Medications   Prior to Admission medications   Medication Sig Start Date End Date Taking? Authorizing Provider  amoxicillin (AMOXIL) 500 MG capsule Take 2 capsules (1,000 mg total) by mouth 2 (two) times daily. 11/23/13   Rodolph BongEvan S Corey, MD  atenolol (TENORMIN) 25 MG tablet Take  25 mg by mouth daily.    Historical Provider, MD  guaiFENesin-codeine (VIRTUSSIN A/C) 100-10 MG/5ML syrup Take 5 mLs by mouth 3 (three) times daily as needed for cough. 11/23/13   Rodolph BongEvan S Corey, MD  hydrochlorothiazide (HYDRODIURIL) 25 MG tablet Take 25 mg by mouth daily.    Historical Provider, MD  ipratropium (ATROVENT) 0.06 % nasal spray Place 2 sprays into both nostrils 4 (four) times daily. 11/23/13   Rodolph BongEvan S Corey, MD  methylPREDNISolone (MEDROL, PAK,) 4 MG tablet follow package directions. Start 05/22/13 11/23/13   Rodolph BongEvan S Corey, MD   BP 141/102  Pulse 97  Temp(Src) 98.9 F (37.2 C) (Oral)  Resp 16  Ht 5\' 6"  (1.676 m)  Wt 212 lb (96.163 kg)  BMI 34.23 kg/m2  SpO2 100%  LMP 09/15/2014 Physical Exam  Nursing note and vitals reviewed. Constitutional: She is oriented to person, place, and time. She appears well-developed and well-nourished. No distress.  HENT:  Head: Atraumatic.  R upper lip is edematous with 1cm ulceration, ttp, no induration/fluctuance or fb.  No trismus, no dental pain.  No obvious blisters  Eyes: Conjunctivae are normal.  Neck: Neck supple.  Lymphadenopathy:    She has no cervical adenopathy.  Neurological: She is alert and oriented to person, place, and time.  Skin: Rash noted.  Psychiatric: She has a normal mood and affect.    ED Course  Procedures (including critical care time)  8:55 AM Sores to Upper lip.  This could be a herpetic blister originally, however difficult to assess as it has been manipulated.  Recommend keflex as treatment for simple skin infection.  OTC Zovirax ointment recommended.  Dermatology referral given.   Labs Review Labs Reviewed - No data to display  Imaging Review No results found.   EKG Interpretation None      MDM   Final diagnoses:  Infection of lip    BP 141/102  Pulse 97  Temp(Src) 98.9 F (37.2 C) (Oral)  Resp 16  Ht 5\' 6"  (1.676 m)  Wt 212 lb (96.163 kg)  BMI 34.23 kg/m2  SpO2 100%  LMP  09/15/2014     Fayrene Helper, PA-C 09/29/14 [phone]

## 2014-09-29 NOTE — ED Notes (Signed)
Pt has bump/sore to right upper lip since the weekend. Is painful and swollen. Has been using peroxide.

## 2014-10-07 NOTE — ED Provider Notes (Signed)
Medical screening examination/treatment/procedure(s) were performed by non-physician practitioner and as supervising physician I was immediately available for consultation/collaboration.   EKG Interpretation None        Rolland PorterMark Emiley Digiacomo, MD 10/07/14 [phone]

## 2014-10-24 ENCOUNTER — Encounter (HOSPITAL_COMMUNITY): Payer: Self-pay | Admitting: Emergency Medicine

## 2015-01-16 ENCOUNTER — Emergency Department (HOSPITAL_COMMUNITY)
Admission: EM | Admit: 2015-01-16 | Discharge: 2015-01-16 | Disposition: A | Discharge status: Home / Self Care | Payer: Managed Care, Other (non HMO) | Attending: Emergency Medicine | Admitting: Emergency Medicine

## 2015-01-16 ENCOUNTER — Emergency Department (HOSPITAL_COMMUNITY): Payer: Managed Care, Other (non HMO)

## 2015-01-16 ENCOUNTER — Encounter (HOSPITAL_COMMUNITY): Payer: Self-pay

## 2015-01-16 DIAGNOSIS — R079 Chest pain, unspecified: Secondary | ICD-10-CM

## 2015-01-16 DIAGNOSIS — Z79899 Other long term (current) drug therapy: Secondary | ICD-10-CM | POA: Diagnosis not present

## 2015-01-16 DIAGNOSIS — I10 Essential (primary) hypertension: Secondary | ICD-10-CM | POA: Diagnosis not present

## 2015-01-16 DIAGNOSIS — R002 Palpitations: Secondary | ICD-10-CM

## 2015-01-16 DIAGNOSIS — Z720 Tobacco use: Secondary | ICD-10-CM | POA: Insufficient documentation

## 2015-01-16 LAB — COMPREHENSIVE METABOLIC PANEL
ALBUMIN: 3.7 g/dL (ref 3.5–5.2)
ALK PHOS: 61 U/L (ref 39–117)
ALT: 10 U/L (ref 0–35)
AST: 17 U/L (ref 0–37)
Anion gap: 6 (ref 5–15)
BUN: 19 mg/dL (ref 6–23)
CALCIUM: 8.9 mg/dL (ref 8.4–10.5)
CHLORIDE: 109 mmol/L (ref 96–112)
CO2: 27 mmol/L (ref 19–32)
CREATININE: 0.68 mg/dL (ref 0.50–1.10)
GFR calc Af Amer: >90 mL/min (ref >90)
GLUCOSE: 93 mg/dL (ref 70–99)
POTASSIUM: 4.1 mmol/L (ref 3.5–5.1)
Sodium: 142 mmol/L (ref 135–145)
Total Bilirubin: 0.4 mg/dL (ref 0.3–1.2)
Total Protein: 6.7 g/dL (ref 6.0–8.3)

## 2015-01-16 LAB — CBC WITH DIFFERENTIAL/PLATELET
Basophils Absolute: 0.1 10*3/uL (ref 0.0–0.1)
Basophils Relative: 1 % (ref 0–1)
EOS ABS: 0.4 10*3/uL (ref 0.0–0.7)
Eosinophils Relative: 6 % — ABNORMAL HIGH (ref 0–5)
HEMATOCRIT: 38.8 % (ref 36.0–46.0)
Hemoglobin: 13.3 g/dL (ref 12.0–15.0)
Lymphocytes Relative: 22 % (ref 12–46)
Lymphs Abs: 1.5 10*3/uL (ref 0.7–4.0)
MCH: 33.8 pg (ref 26.0–34.0)
MCHC: 34.3 g/dL (ref 30.0–36.0)
MCV: 98.5 fL (ref 78.0–100.0)
Monocytes Absolute: 0.3 10*3/uL (ref 0.1–1.0)
Monocytes Relative: 5 % (ref 3–12)
Neutro Abs: 4.6 10*3/uL (ref 1.7–7.7)
Neutrophils Relative %: 66 % (ref 43–77)
Platelets: 194 10*3/uL (ref 150–400)
RBC: 3.94 MIL/uL (ref 3.87–5.11)
RDW: 14 % (ref 11.5–15.5)
WBC: 6.9 10*3/uL (ref 4.0–10.5)

## 2015-01-16 LAB — TROPONIN I: Troponin I: <0.03 ng/mL (ref <0.031)

## 2015-01-16 MED ORDER — HYDROCHLOROTHIAZIDE 25 MG PO TABS: 25.0000 mg | ORAL_TABLET | Freq: Every day | ORAL | Status: DC

## 2015-01-16 MED ORDER — ATENOLOL 25 MG PO TABS: 25.0000 mg | ORAL_TABLET | Freq: Every day | ORAL | Status: DC

## 2015-01-16 NOTE — ED Notes (Signed)
Pt here for a fluttering feeling in her chest that started yesterday when she was cleaning up. Also reports some pain in her left arm over the past 4 days that is still bothering her. Denies any cp and has been out of her BP med this weekend.

## 2015-01-16 NOTE — ED Notes (Signed)
Karen, PA at the bedside.  

## 2015-01-16 NOTE — ED Notes (Signed)
Lab at the bedside 

## 2015-01-16 NOTE — ED Notes (Signed)
Pt returned from xray

## 2015-01-16 NOTE — Discharge Instructions (Signed)
Palpitations  A palpitation is the feeling that your heartbeat is irregular or is faster than normal. It may feel like your heart is fluttering or skipping a beat. Palpitations are usually not a serious problem. However, in some cases, you may need further medical evaluation.  CAUSES   Palpitations can be caused by:   Smoking.   Caffeine or other stimulants, such as diet pills or energy drinks.   Alcohol.   Stress and anxiety.   Strenuous physical activity.   Fatigue.   Certain medicines.   Heart disease, especially if you have a history of irregular heart rhythms (arrhythmias), such as atrial fibrillation, atrial flutter, or supraventricular tachycardia.   An improperly working pacemaker or defibrillator.  DIAGNOSIS   To find the cause of your palpitations, your health care provider will take your medical history and perform a physical exam. Your health care provider may also have you take a test called an ambulatory electrocardiogram (ECG). An ECG records your heartbeat patterns over a 24-hour period. You may also have other tests, such as:   Transthoracic echocardiogram (TTE). During echocardiography, sound waves are used to evaluate how blood flows through your heart.   Transesophageal echocardiogram (TEE).   Cardiac monitoring. This allows your health care provider to monitor your heart rate and rhythm in real time.   Holter monitor. This is a portable device that records your heartbeat and can help diagnose heart arrhythmias. It allows your health care provider to track your heart activity for several days, if needed.   Stress tests by exercise or by giving medicine that makes the heart beat faster.  TREATMENT   Treatment of palpitations depends on the cause of your symptoms and can vary greatly. Most cases of palpitations do not require any treatment other than time, relaxation, and monitoring your symptoms. Other causes, such as atrial fibrillation, atrial flutter, or supraventricular  tachycardia, usually require further treatment.  HOME CARE INSTRUCTIONS    Avoid:   Caffeinated coffee, tea, soft drinks, diet pills, and energy drinks.   Chocolate.   Alcohol.   Stop smoking if you smoke.   Reduce your stress and anxiety. Things that can help you relax include:   A method of controlling things in your body, such as your heartbeats, with your mind (biofeedback).   Yoga.   Meditation.   Physical activity such as swimming, jogging, or walking.   Get plenty of rest and sleep.  SEEK MEDICAL CARE IF:    You continue to have a fast or irregular heartbeat beyond 24 hours.   Your palpitations occur more often.  SEEK IMMEDIATE MEDICAL CARE IF:   You have chest pain or shortness of breath.   You have a severe headache.   You feel dizzy or you faint.  MAKE SURE YOU:   Understand these instructions.   Will watch your condition.   Will get help right away if you are not doing well or get worse.  Document Released: 12/06/2000 Document Revised: 12/14/2013 Document Reviewed: 02/07/2012  ExitCare Patient Information 2015 [employer]. This information is not intended to replace advice given to you by your health care provider. Make sure you discuss any questions you have with your health care provider.    Hypertension  Hypertension, commonly called high blood pressure, is when the force of blood pumping through your arteries is too strong. Your arteries are the blood vessels that carry blood from your heart throughout your body. A blood pressure reading consists of a higher number   over a lower number, such as 110/72. The higher number (systolic) is the pressure inside your arteries when your heart pumps. The lower number (diastolic) is the pressure inside your arteries when your heart relaxes. Ideally you want your blood pressure below 120/80.  Hypertension forces your heart to work harder to pump blood. Your arteries may become narrow or stiff. Having hypertension puts you at risk for heart  disease, stroke, and other problems.   RISK FACTORS  Some risk factors for high blood pressure are controllable. Others are not.   Risk factors you cannot control include:    Race. You may be at higher risk if you are African American.   Age. Risk increases with age.   Gender. Men are at higher risk than women before age 45 years. After age 65, women are at higher risk than men.  Risk factors you can control include:   Not getting enough exercise or physical activity.   Being overweight.   Getting too much fat, sugar, calories, or salt in your diet.   Drinking too much alcohol.  SIGNS AND SYMPTOMS  Hypertension does not usually cause signs or symptoms. Extremely high blood pressure (hypertensive crisis) may cause headache, anxiety, shortness of breath, and nosebleed.  DIAGNOSIS   To check if you have hypertension, your health care provider will measure your blood pressure while you are seated, with your arm held at the level of your heart. It should be measured at least twice using the same arm. Certain conditions can cause a difference in blood pressure between your right and left arms. A blood pressure reading that is higher than normal on one occasion does not mean that you need treatment. If one blood pressure reading is high, ask your health care provider about having it checked again.  TREATMENT   Treating high blood pressure includes making lifestyle changes and possibly taking medicine. Living a healthy lifestyle can help lower high blood pressure. You may need to change some of your habits.  Lifestyle changes may include:   Following the DASH diet. This diet is high in fruits, vegetables, and whole grains. It is low in salt, red meat, and added sugars.   Getting at least 2 hours of brisk physical activity every week.   Losing weight if necessary.   Not smoking.   Limiting alcoholic beverages.   Learning ways to reduce stress.  If lifestyle changes are not enough to get your blood pressure  under control, your health care provider may prescribe medicine. You may need to take more than one. Work closely with your health care provider to understand the risks and benefits.  HOME CARE INSTRUCTIONS   Have your blood pressure rechecked as directed by your health care provider.    Take medicines only as directed by your health care provider. Follow the directions carefully. Blood pressure medicines must be taken as prescribed. The medicine does not work as well when you skip doses. Skipping doses also puts you at risk for problems.    Do not smoke.    Monitor your blood pressure at home as directed by your health care provider.  SEEK MEDICAL CARE IF:    You think you are having a reaction to medicines taken.   You have recurrent headaches or feel dizzy.   You have swelling in your ankles.   You have trouble with your vision.  SEEK IMMEDIATE MEDICAL CARE IF:   You develop a severe headache or confusion.   You have unusual   weakness, numbness, or feel faint.   You have severe chest or abdominal pain.   You vomit repeatedly.   You have trouble breathing.  MAKE SURE YOU:    Understand these instructions.   Will watch your condition.   Will get help right away if you are not doing well or get worse.  Document Released: 12/09/2005 Document Revised: 04/25/2014 Document Reviewed: 10/01/2013  ExitCare Patient Information 2015 [employer]. This information is not intended to replace advice given to you by your health care provider. Make sure you discuss any questions you have with your health care provider.

## 2015-01-16 NOTE — ED Notes (Signed)
Patient transported to X-ray 

## 2015-01-16 NOTE — ED Provider Notes (Signed)
CSN: [id number]     Arrival date & time 01/16/15  [id number] History   First MD Initiated Contact with Patient 01/16/15 [phone]     Chief Complaint  Patient presents with  . Chest Pain     (Consider location/radiation/quality/duration/timing/severity/associated sxs/prior Treatment) Patient is a 49 y.o. female presenting with chest pain. The history is provided by the patient. No language interpreter was used.  Chest Pain Pain location:  L chest Pain quality comment:  Fluttering Pain radiates to:  Does not radiate Pain radiates to the back: no   Pain severity:  No pain Onset quality:  Gradual Timing:  Constant Progression:  Worsening Chronicity:  New Relieved by:  Nothing Worsened by:  Nothing tried Ineffective treatments:  None tried Associated symptoms: palpitations   Associated symptoms: no shortness of breath, no syncope and not vomiting   Risk factors: hypertension   Pt is out of her blood pressure medication.  Pt feels like her heart has been fluttering.  No current mD  Past Medical History  Diagnosis Date  . Hypertension    Past Surgical History  Procedure Laterality Date  . Induced abortion     Family History  Problem Relation Age of Onset  . Other Neg Hx    History  Substance Use Topics  . Smoking status: Current Every Day Smoker -- 0.50 packs/day for 15 years  . Smokeless tobacco: Not on file  . Alcohol Use: Yes     Comment: mix drink social   OB History    Gravida Para Term Preterm AB TAB SAB Ectopic Multiple Living   Review of Systems  Respiratory: Negative for shortness of breath.   Cardiovascular: Positive for chest pain and palpitations. Negative for syncope.  Gastrointestinal: Negative for vomiting.  All other systems reviewed and are negative.     Allergies  Review of patient's allergies indicates no known allergies.  Home Medications   Prior to Admission medications   Medication Sig Start Date End Date Taking? Authorizing  Provider  atenolol (TENORMIN) 25 MG tablet Take 25 mg by mouth daily.   Yes Historical Provider, MD  Multiple Vitamins-Minerals (MULTIVITAMIN WITH MINERALS) tablet Take 1 tablet by mouth daily.   Yes Historical Provider, MD  amoxicillin (AMOXIL) 500 MG capsule Take 2 capsules (1,000 mg total) by mouth 2 (two) times daily. Patient not taking: Reported on 01/16/2015 11/23/13   Rodolph Bong, MD  cephALEXin (KEFLEX) 500 MG capsule Take 2 capsules (1,000 mg total) by mouth 2 (two) times daily. Patient not taking: Reported on 01/16/2015 09/29/14   Fayrene Helper, PA-C  guaiFENesin-codeine (VIRTUSSIN A/C) 100-10 MG/5ML syrup Take 5 mLs by mouth 3 (three) times daily as needed for cough. Patient not taking: Reported on 01/16/2015 11/23/13   Rodolph Bong, MD  hydrochlorothiazide (HYDRODIURIL) 25 MG tablet Take 25 mg by mouth daily.    Historical Provider, MD  ibuprofen (ADVIL,MOTRIN) 800 MG tablet Take 1 tablet (800 mg total) by mouth 3 (three) times daily. Patient not taking: Reported on 01/16/2015 09/29/14   Fayrene Helper, PA-C  ipratropium (ATROVENT) 0.06 % nasal spray Place 2 sprays into both nostrils 4 (four) times daily. Patient not taking: Reported on 01/16/2015 11/23/13   Rodolph Bong, MD  methylPREDNISolone (MEDROL, PAK,) 4 MG tablet follow package directions. Start 05/22/13 Patient not taking: Reported on 01/16/2015 11/23/13   Rodolph Bong, MD   BP 137/88 mmHg  Pulse 80  Temp(Src) 98 F (36.7 C) (Oral)  Resp 18  Ht 5\' 5"  (1.651 m)  Wt 212 lb (96.163 kg)  BMI 35.28 kg/m2  SpO2 100%  LMP 01/16/2015 Physical Exam  Constitutional: She is oriented to person, place, and time. She appears well-developed and well-nourished.  HENT:  Head: Normocephalic and atraumatic.  Right Ear: External ear normal.  Left Ear: External ear normal.  Eyes: Conjunctivae are normal. Pupils are equal, round, and reactive to light.  Neck: Normal range of motion. Neck supple.  Cardiovascular: Normal rate, normal heart sounds and  intact distal pulses.   Pulmonary/Chest: Effort normal and breath sounds normal.  Abdominal: Soft.  Musculoskeletal: Normal range of motion.  Neurological: She is alert and oriented to person, place, and time. She has normal reflexes.  Skin: Skin is warm.  Psychiatric: She has a normal mood and affect.  Nursing note and vitals reviewed.   ED Course  Procedures (including critical care time) Labs Review Labs Reviewed  CBC WITH DIFFERENTIAL/PLATELET - Abnormal; Notable for the following:    Eosinophils Relative 6 (*)    All other components within normal limits  COMPREHENSIVE METABOLIC PANEL  TROPONIN I    Imaging Review Dg Chest 2 View  01/16/2015   CLINICAL DATA:  Bladder ring sensation in the chest yesterday during physical activity also left arm pain over the past 4 days which persists. History of hypertension, currently add of medication  EXAM: CHEST  2 VIEW  COMPARISON:  None.  FINDINGS: The lungs are adequately inflated. There is no focal infiltrate. The heart and pulmonary vascularity are normal. The trachea is midline. There is no mediastinal widening. There is no pleural effusion or pneumothorax. The bony thorax is unremarkable.  IMPRESSION: There is no active cardiopulmonary disease.   Electronically Signed   By: David  [place]   On: 01/16/2015 10:19     EKG Interpretation None    NS 87 normal ekg no st or t wave changes, normal axis  MDM   Final diagnoses:  Chest pain  Palpitations  Essential hypertension    Pt given rx for hctz and atenolol.    Pt advised to see her Md for recheck.    Lonia SkinnerLeslie K Lake MonticelloSofia, PA-C 01/16/15 1133  Enid SkeensJoshua M Zavitz, MD 01/16/15 [phone]

## 2015-04-10 ENCOUNTER — Emergency Department (INDEPENDENT_AMBULATORY_CARE_PROVIDER_SITE_OTHER)
Admission: EM | Admit: 2015-04-10 | Discharge: 2015-04-10 | Disposition: A | Discharge status: Home / Self Care | Payer: Managed Care, Other (non HMO) | Source: Home / Self Care | Attending: Emergency Medicine | Admitting: Emergency Medicine

## 2015-04-10 ENCOUNTER — Encounter (HOSPITAL_COMMUNITY): Payer: Self-pay | Admitting: Emergency Medicine

## 2015-04-10 DIAGNOSIS — L253 Unspecified contact dermatitis due to other chemical products: Secondary | ICD-10-CM

## 2015-04-10 MED ORDER — MUPIROCIN 2 % EX OINT: TOPICAL_OINTMENT | CUTANEOUS | Status: DC

## 2015-04-10 MED ORDER — PREDNISONE 10 MG PO TABS: 20.0000 mg | ORAL_TABLET | Freq: Every day | ORAL | Status: DC

## 2015-04-10 MED ORDER — HYDROXYZINE HCL 25 MG PO TABS: 25.0000 mg | ORAL_TABLET | Freq: Three times a day (TID) | ORAL | Status: DC | PRN

## 2015-04-10 MED ORDER — FAMOTIDINE 20 MG PO TABS: 20.0000 mg | ORAL_TABLET | Freq: Two times a day (BID) | ORAL | Status: DC

## 2015-04-10 NOTE — ED Notes (Signed)
Pt states that she has had a allergic reaction to hair products she used in her hair on 04/09/2015

## 2015-04-10 NOTE — Discharge Instructions (Signed)

## 2015-04-10 NOTE — ED Provider Notes (Signed)
CSN: [id number]     Arrival date & time 04/10/15  1211 History   First MD Initiated Contact with Patient 04/10/15 1409     Chief Complaint  Patient presents with  . Allergic Reaction   (Consider location/radiation/quality/duration/timing/severity/associated sxs/prior Treatment) HPI Comments: Patient states she applied a chemical rinse to her hair to relax and color her hair on 04/08/2015. Then when she woke on 04/09/2015 with a red, itchy weeping rash along the hairline around the front and back of her scalp. Has tried taking benadryl for this with little improvement. No swelling of face, lips or tongue. No pain or fever  The history is provided by the patient.    Past Medical History  Diagnosis Date  . Hypertension    Past Surgical History  Procedure Laterality Date  . Induced abortion     Family History  Problem Relation Age of Onset  . Other Neg Hx    History  Substance Use Topics  . Smoking status: Current Every Day Smoker -- 0.50 packs/day for 15 years  . Smokeless tobacco: Not on file  . Alcohol Use: Yes     Comment: mix drink social   OB History    Gravida Para Term Preterm AB TAB SAB Ectopic Multiple Living   4 2 2  1 1    2      Review of Systems  All other systems reviewed and are negative.   Allergies  Review of patient's allergies indicates no known allergies.  Home Medications   Prior to Admission medications   Medication Sig Start Date End Date Taking? Authorizing Provider  amoxicillin (AMOXIL) 500 MG capsule Take 2 capsules (1,000 mg total) by mouth 2 (two) times daily. Patient not taking: Reported on 01/16/2015 11/23/13   Rodolph BongEvan S Corey, MD  atenolol (TENORMIN) 25 MG tablet Take 1 tablet (25 mg total) by mouth daily. 01/16/15   Elson AreasLeslie K Sofia, PA-C  famotidine (PEPCID) 20 MG tablet Take 1 tablet (20 mg total) by mouth 2 (two) times daily. X 5 days 04/10/15   Mathis FareJennifer Lee H Brice Kossman, PA  hydrochlorothiazide (HYDRODIURIL) 25 MG tablet Take 1 tablet (25 mg  total) by mouth daily. 01/16/15   Elson AreasLeslie K Sofia, PA-C  hydrOXYzine (ATARAX/VISTARIL) 25 MG tablet Take 1 tablet (25 mg total) by mouth every 8 (eight) hours as needed for itching. 04/10/15   Mathis FareJennifer Lee H Yoan Sallade, PA  ibuprofen (ADVIL,MOTRIN) 800 MG tablet Take 1 tablet (800 mg total) by mouth 3 (three) times daily. Patient not taking: Reported on 01/16/2015 09/29/14   Fayrene HelperBowie Tran, PA-C  Multiple Vitamins-Minerals (MULTIVITAMIN WITH MINERALS) tablet Take 1 tablet by mouth daily.    Historical Provider, MD  mupirocin ointment (BACTROBAN) 2 % Apply to weeping areas on scalp BID x 7 days 04/10/15   Ria ClockJennifer Lee H Pranish Akhavan, PA  predniSONE (DELTASONE) 10 MG tablet Take 2 tablets (20 mg total) by mouth daily. Take 5 tabs po QD day 1, 4 tabs po QD day 2, 3 tabs po QD day 3, 2 tabs po QD day 4 and 1 tab po QD day 5 then stop 04/10/15   Jess BartersJennifer Lee H Elnor Renovato, PA   BP 135/90 mmHg  Pulse 74  Temp(Src) 98.4 F (36.9 C) (Oral)  Resp 16  SpO2 98% Physical Exam  Constitutional: She is oriented to person, place, and time. She appears well-developed and well-nourished. No distress.  HENT:  Head: Normocephalic and atraumatic.    Right Ear: Hearing and external ear normal.  Left  Ear: Hearing and external ear normal.  Nose: Nose normal.  Mouth/Throat: Uvula is midline and mucous membranes are normal. No oral lesions. No uvula swelling.  Outlined areas are regions of urticarial erythematous weeping rash.   Eyes: Conjunctivae are normal.  Cardiovascular: Normal rate.   Pulmonary/Chest: Effort normal.  Musculoskeletal: Normal range of motion.  Neurological: She is alert and oriented to person, place, and time.  Skin:  See ENT  Nursing note and vitals reviewed.   ED Course  Procedures (including critical care time) Labs Review Labs Reviewed - No data to display  Imaging Review No results found.   MDM   1. Contact dermatitis due to chemicals   Use mild unscented shampoo daily until rash  resolves Prednisone taper, Atarax, Pepcid x 5 days as prescribed Follow up if no improvement.   Ria Clock, [place] 04/10/15 1434

## 2015-06-29 ENCOUNTER — Emergency Department (INDEPENDENT_AMBULATORY_CARE_PROVIDER_SITE_OTHER)
Admission: EM | Admit: 2015-06-29 | Discharge: 2015-06-29 | Disposition: A | Discharge status: Home / Self Care | Payer: Managed Care, Other (non HMO) | Source: Home / Self Care | Attending: Family Medicine | Admitting: Family Medicine

## 2015-06-29 ENCOUNTER — Encounter (HOSPITAL_COMMUNITY): Payer: Self-pay | Admitting: Emergency Medicine

## 2015-06-29 DIAGNOSIS — T148 Other injury of unspecified body region: Secondary | ICD-10-CM

## 2015-06-29 DIAGNOSIS — L200 Besnier's prurigo: Secondary | ICD-10-CM | POA: Diagnosis not present

## 2015-06-29 DIAGNOSIS — W57XXXA Bitten or stung by nonvenomous insect and other nonvenomous arthropods, initial encounter: Secondary | ICD-10-CM | POA: Diagnosis not present

## 2015-06-29 DIAGNOSIS — T7840XA Allergy, unspecified, initial encounter: Secondary | ICD-10-CM

## 2015-06-29 DIAGNOSIS — L239 Allergic contact dermatitis, unspecified cause: Secondary | ICD-10-CM

## 2015-06-29 MED ORDER — TRIAMCINOLONE ACETONIDE 0.1 % EX CREA: 1.0000 "application " | TOPICAL_CREAM | Freq: Two times a day (BID) | CUTANEOUS | Status: DC

## 2015-06-29 MED ORDER — PREDNISONE 10 MG PO TABS: 20.0000 mg | ORAL_TABLET | Freq: Every day | ORAL | Status: DC

## 2015-06-29 NOTE — Discharge Instructions (Signed)
Possible Insect Bite Also use Benadryl cream and local cold compresses Mosquitoes, flies, fleas, bedbugs, and other insects can bite. Insect bites are different from insect stings. The bite may be red, puffy (swollen), and itchy for 2 to 4 days. Most bites get better on their own. HOME CARE   Do not scratch the bite.  Keep the bite clean and dry. Wash the bite with soap and water.  Put ice on the bite.  Put ice in a plastic bag.  Place a towel between your skin and the bag.  Leave the ice on for 20 minutes, 4 times a day. Do this for the first 2 to 3 days, or as told by your doctor.  You may use medicated lotions or creams to lessen itching as told by your doctor.  Only take medicines as told by your doctor.  If you are given medicines (antibiotics), take them as told. Finish them even if you start to feel better. You may need a tetanus shot if:  You cannot remember when you had your last tetanus shot.  You have never had a tetanus shot.  The injury broke your skin. If you need a tetanus shot and you choose not to have one, you may get tetanus. Sickness from tetanus can be serious. GET HELP RIGHT AWAY IF:   You have more pain, redness, or puffiness.  You see a red line on the skin coming from the bite.  You have a fever.  You have joint pain.  You have a headache or neck pain.  You feel weak.  You have a rash.  You have chest pain, or you are short of breath.  You have belly (abdominal) pain.  You feel sick to your stomach (nauseous) or throw up (vomit).  You feel very tired or sleepy. MAKE SURE YOU:   Understand these instructions.  Will watch your condition.  Will get help right away if you are not doing well or get worse. Document Released: 12/06/2000 Document Revised: 03/02/2012 Document Reviewed: 07/10/2011 [facility]ExitCare Patient Information 2015 [employer] [place]. This information is not intended to replace advice given to you by your health care provider.  Make sure you discuss any questions you have with your health care provider.

## 2015-06-29 NOTE — ED Provider Notes (Signed)
CSN: [id number]     Arrival date & time 06/29/15  1305 History   First MD Initiated Contact with Patient 06/29/15 1424     Chief Complaint  Patient presents with  . Arm Pain   (Consider location/radiation/quality/duration/timing/severity/associated sxs/prior Treatment) HPI Comments: There is a 4 cm x 1.5 cm area of a red, raised patch of papulovesicular lesions surrounded by superficial induration and erythema to the right volar wrist  Noticed last evening. Burns and stings and tingling. No lymphangitis or open areas, no drainage or bleeding.   Past Medical History  Diagnosis Date  . Hypertension    Past Surgical History  Procedure Laterality Date  . Induced abortion     Family History  Problem Relation Age of Onset  . Other Neg Hx    History  Substance Use Topics  . Smoking status: Current Every Day Smoker -- 0.50 packs/day for 15 years  . Smokeless tobacco: Not on file  . Alcohol Use: Yes     Comment: mix drink social   OB History    Gravida Para Term Preterm AB TAB SAB Ectopic Multiple Living   4 2 2  1 1    2      Review of Systems  Constitutional: Negative.   HENT: Negative.   Respiratory: Negative.   Gastrointestinal: Negative.   Skin: Positive for rash.  Neurological: Negative.     Allergies  Review of patient's allergies indicates no known allergies.  Home Medications   Prior to Admission medications   Medication Sig Start Date End Date Taking? Authorizing Provider  atenolol (TENORMIN) 25 MG tablet Take 1 tablet (25 mg total) by mouth daily. 01/16/15   Elson AreasLeslie K Sofia, PA-C  famotidine (PEPCID) 20 MG tablet Take 1 tablet (20 mg total) by mouth 2 (two) times daily. X 5 days 04/10/15   Mathis FareJennifer Lee H Presson, PA  hydrochlorothiazide (HYDRODIURIL) 25 MG tablet Take 1 tablet (25 mg total) by mouth daily. 01/16/15   Elson AreasLeslie K Sofia, PA-C  hydrOXYzine (ATARAX/VISTARIL) 25 MG tablet Take 1 tablet (25 mg total) by mouth every 8 (eight) hours as needed for itching.  04/10/15   Ria ClockJennifer Lee H Presson, PA  Multiple Vitamins-Minerals (MULTIVITAMIN WITH MINERALS) tablet Take 1 tablet by mouth daily.    Historical Provider, MD  mupirocin ointment (BACTROBAN) 2 % Apply to weeping areas on scalp BID x 7 days 04/10/15   Ria ClockJennifer Lee H Presson, PA  predniSONE (DELTASONE) 10 MG tablet Take 2 tablets (20 mg total) by mouth daily. 06/29/15   Hayden Rasmussenavid Laira Penninger, NP  triamcinolone cream (KENALOG) 0.1 % Apply 1 application topically 2 (two) times daily. 06/29/15   Hayden Rasmussenavid Willian Donson, NP   BP 131/84 mmHg  Pulse 71  Temp(Src) 98.9 F (37.2 C) (Oral)  Resp 16  SpO2 100%  LMP 06/29/2015 Physical Exam  Constitutional: She is oriented to person, place, and time. She appears well-developed and well-nourished. No distress.  HENT:  Mouth/Throat: Oropharynx is clear and moist.  Eyes: Conjunctivae and EOM are normal.  Neck: Normal range of motion. Neck supple.  Cardiovascular: Normal rate.   Pulmonary/Chest: Effort normal. No respiratory distress.  Musculoskeletal:  Minor localized edema to the right volar wrist/forearm, small area. No bony tenderness or joint involvement.  Neurological: She is alert and oriented to person, place, and time. She exhibits normal muscle tone.  Skin: Skin is warm and dry. There is erythema.  See HPI for description.  Psychiatric: She has a normal mood and affect.  Nursing note  and vitals reviewed.   ED Course  Procedures (including critical care time) Labs Review Labs Reviewed - No data to display  Imaging Review No results found.   MDM   1. Acute allergic reaction, initial encounter   2. Allergic dermatitis   3. Insect bite    Also use Benadryl cream and local cold compresses Triamcinolone cream Prednisone for 3 days.    Hayden Rasmussen, NP 06/29/15 [id number]

## 2015-06-29 NOTE — ED Notes (Signed)
Swollen right forearm.  Patient noted swelling last night, since then the area has worsened in appearance.  Red, swollen forearm, altered sensation in right wrist.  No known injury, unknown insect bite.

## 2016-01-26 ENCOUNTER — Encounter (HOSPITAL_COMMUNITY): Payer: Self-pay | Admitting: Emergency Medicine

## 2016-01-26 ENCOUNTER — Emergency Department (INDEPENDENT_AMBULATORY_CARE_PROVIDER_SITE_OTHER)
Admission: EM | Admit: 2016-01-26 | Discharge: 2016-01-26 | Disposition: A | Discharge status: Home / Self Care | Payer: Managed Care, Other (non HMO) | Source: Home / Self Care | Attending: Family Medicine | Admitting: Family Medicine

## 2016-01-26 DIAGNOSIS — J069 Acute upper respiratory infection, unspecified: Secondary | ICD-10-CM | POA: Diagnosis not present

## 2016-01-26 MED ORDER — ALBUTEROL SULFATE HFA 108 (90 BASE) MCG/ACT IN AERS: 1.0000 | INHALATION_SPRAY | Freq: Four times a day (QID) | RESPIRATORY_TRACT | Status: DC | PRN

## 2016-01-26 NOTE — ED Provider Notes (Signed)
CSN: [id number]     Arrival date & time 01/26/16  1303 History   First MD Initiated Contact with Patient 01/26/16 1317     No chief complaint on file.  (Consider location/radiation/quality/duration/timing/severity/associated sxs/prior Treatment) HPI Cold symptoms for the last 3-4 days. OTC medications being used without relief.  Congested so some trouble with deep breathing. Past Medical History  Diagnosis Date  . Hypertension    Past Surgical History  Procedure Laterality Date  . Induced abortion     Family History  Problem Relation Age of Onset  . Other Neg Hx    Social History  Substance Use Topics  . Smoking status: Current Every Day Smoker -- 0.50 packs/day for 15 years  . Smokeless tobacco: Not on file  . Alcohol Use: Yes     Comment: mix drink social   OB History    Gravida Para Term Preterm AB TAB SAB Ectopic Multiple Living   Review of Systems ROS +'ve URI symptoms, runny nose, sneeze, cough  Denies: HEADACHE, NAUSEA, ABDOMINAL PAIN, CHEST PAIN, CONGESTION, DYSURIA, SHORTNESS OF BREATH  Allergies  Review of patient's allergies indicates no known allergies.  Home Medications   Prior to Admission medications   Medication Sig Start Date End Date Taking? Authorizing Provider  atenolol (TENORMIN) 25 MG tablet Take 1 tablet (25 mg total) by mouth daily. 01/16/15   Elson Areas, PA-C  famotidine (PEPCID) 20 MG tablet Take 1 tablet (20 mg total) by mouth 2 (two) times daily. X 5 days 04/10/15   Mathis Fare Presson, PA  hydrochlorothiazide (HYDRODIURIL) 25 MG tablet Take 1 tablet (25 mg total) by mouth daily. 01/16/15   Elson Areas, PA-C  hydrOXYzine (ATARAX/VISTARIL) 25 MG tablet Take 1 tablet (25 mg total) by mouth every 8 (eight) hours as needed for itching. 04/10/15   Ria Clock, PA  Multiple Vitamins-Minerals (MULTIVITAMIN WITH MINERALS) tablet Take 1 tablet by mouth daily.    Historical Provider, MD  mupirocin ointment  (BACTROBAN) 2 % Apply to weeping areas on scalp BID x 7 days 04/10/15   Ria Clock, PA  predniSONE (DELTASONE) 10 MG tablet Take 2 tablets (20 mg total) by mouth daily. 06/29/15   Hayden Rasmussen, NP  triamcinolone cream (KENALOG) 0.1 % Apply 1 application topically 2 (two) times daily. 06/29/15   Hayden Rasmussen, NP   Meds Ordered and Administered this Visit  Medications - No data to display  BP 140/96 mmHg  Pulse 85  Temp(Src) 98.7 F (37.1 C) (Oral)  SpO2 100% No data found.   Physical Exam  Constitutional: She is oriented to person, place, and time. She appears well-developed and well-nourished.  HENT:  Head: Normocephalic and atraumatic.  Right Ear: External ear normal.  Left Ear: External ear normal.  Mouth/Throat: Oropharynx is clear and moist.  Eyes: Conjunctivae are normal.  Neck: Normal range of motion. Neck supple.  Cardiovascular: Normal rate.   Pulmonary/Chest: Effort normal and breath sounds normal.  Abdominal: Soft. There is no tenderness. There is no rebound and no guarding.  Musculoskeletal: Normal range of motion.  Neurological: She is alert and oriented to person, place, and time.  Skin: Skin is warm and dry.  Psychiatric: She has a normal mood and affect. Her behavior is normal.  Nursing note and vitals reviewed.   ED Course  Procedures (including critical care time)  Labs Review Labs Reviewed - No  data to display  Imaging Review No results found.   Visual Acuity Review  Right Eye Distance:   Left Eye Distance:   Bilateral Distance:    Right Eye Near:   Left Eye Near:    Bilateral Near:         MDM   1. Acute URI     Patient is advised to continue home symptomatic treatment.  Patient is advised that if there are new or worsening symptoms or attend the emergency department, or contact primary care provider. Instructions of care provided discharged home in stable condition. Return to work/school note provided.  THIS NOTE WAS GENERATED  USING A VOICE RECOGNITION SOFTWARE PROGRAM. ALL REASONABLE EFFORTS  WERE MADE TO PROOFREAD THIS DOCUMENT FOR ACCURACY.     Tharon Aquas, PA 01/26/16 2039

## 2016-01-26 NOTE — Discharge Instructions (Signed)
Upper Respiratory Infection, Adult Most upper respiratory infections (URIs) are a viral infection of the air passages leading to the lungs. A URI affects the nose, throat, and upper air passages. The most common type of URI is nasopharyngitis and is typically referred to as "the common cold." URIs run their course and usually go away on their own. Most of the time, a URI does not require medical attention, but sometimes a bacterial infection in the upper airways can follow a viral infection. This is called a secondary infection. Sinus and middle ear infections are common types of secondary upper respiratory infections. Bacterial pneumonia can also complicate a URI. A URI can worsen asthma and chronic obstructive pulmonary disease (COPD). Sometimes, these complications can require emergency medical care and may be life threatening.  CAUSES Almost all URIs are caused by viruses. A virus is a type of germ and can spread from one person to another.  RISKS FACTORS You may be at risk for a URI if:   You smoke.   You have chronic heart or lung disease.  You have a weakened defense (immune) system.   You are very young or very old.   You have nasal allergies or asthma.  You work in crowded or poorly ventilated areas.  You work in health care facilities or schools. SIGNS AND SYMPTOMS  Symptoms typically develop 2-3 days after you come in contact with a cold virus. Most viral URIs last 7-10 days. However, viral URIs from the influenza virus (flu virus) can last 14-18 days and are typically more severe. Symptoms may include:   Runny or stuffy (congested) nose.   Sneezing.   Cough.   Sore throat.   Headache.   Fatigue.   Fever.   Loss of appetite.   Pain in your forehead, behind your eyes, and over your cheekbones (sinus pain).  Muscle aches.  DIAGNOSIS  Your health care provider may diagnose a URI by:  Physical exam.  Tests to check that your symptoms are not due to  another condition such as:  Strep throat.  Sinusitis.  Pneumonia.  Asthma. TREATMENT  A URI goes away on its own with time. It cannot be cured with medicines, but medicines may be prescribed or recommended to relieve symptoms. Medicines may help:  Reduce your fever.  Reduce your cough.  Relieve nasal congestion. HOME CARE INSTRUCTIONS   Take medicines only as directed by your health care provider.   Gargle warm saltwater or take cough drops to comfort your throat as directed by your health care provider.  Use a warm mist humidifier or inhale steam from a shower to increase air moisture. This may make it easier to breathe.  Drink enough fluid to keep your urine clear or pale yellow.   Eat soups and other clear broths and maintain good nutrition.   Rest as needed.   Return to work when your temperature has returned to normal or as your health care provider advises. You may need to stay home longer to avoid infecting others. You can also use a face mask and careful hand washing to prevent spread of the virus.  Increase the usage of your inhaler if you have asthma.   Do not use any tobacco products, including cigarettes, chewing tobacco, or electronic cigarettes. If you need help quitting, ask your health care provider. PREVENTION  The best way to protect yourself from getting a cold is to practice good hygiene.   Avoid oral or hand contact with people with cold   symptoms.   Wash your hands often if contact occurs.  There is no clear evidence that vitamin C, vitamin E, echinacea, or exercise reduces the chance of developing a cold. However, it is always recommended to get plenty of rest, exercise, and practice good nutrition.  SEEK MEDICAL CARE IF:   You are getting worse rather than better.   Your symptoms are not controlled by medicine.   You have chills.  You have worsening shortness of breath.  You have brown or red mucus.  You have yellow or brown nasal  discharge.  You have pain in your face, especially when you bend forward.  You have a fever.  You have swollen neck glands.  You have pain while swallowing.  You have white areas in the back of your throat. SEEK IMMEDIATE MEDICAL CARE IF:   You have severe or persistent:  Headache.  Ear pain.  Sinus pain.  Chest pain.  You have chronic lung disease and any of the following:  Wheezing.  Prolonged cough.  Coughing up blood.  A change in your usual mucus.  You have a stiff neck.  You have changes in your:  Vision.  Hearing.  Thinking.  Mood. MAKE SURE YOU:   Understand these instructions.  Will watch your condition.  Will get help right away if you are not doing well or get worse.   This information is not intended to replace advice given to you by your health care provider. Make sure you discuss any questions you have with your health care provider.   Document Released: 06/04/2001 Document Revised: 04/25/2015 Document Reviewed: 03/16/2014 Elsevier Interactive Patient Education 2016 [employer]  

## 2016-01-26 NOTE — ED Notes (Signed)
C/o cold sx onset 2 days associated w/dry cough, runny nose, congestion, PND A&O x4... No acute distress.

## 2016-04-24 ENCOUNTER — Ambulatory Visit (HOSPITAL_COMMUNITY)
Admission: EM | Admit: 2016-04-24 | Discharge: 2016-04-24 | Disposition: A | Discharge status: Home / Self Care | Payer: Managed Care, Other (non HMO) | Attending: Family Medicine | Admitting: Family Medicine

## 2016-04-24 ENCOUNTER — Encounter (HOSPITAL_COMMUNITY): Payer: Self-pay | Admitting: Emergency Medicine

## 2016-04-24 DIAGNOSIS — I10 Essential (primary) hypertension: Secondary | ICD-10-CM

## 2016-04-24 MED ORDER — ATENOLOL 25 MG PO TABS: 25.0000 mg | ORAL_TABLET | Freq: Every day | ORAL | Status: DC

## 2016-04-24 MED ORDER — HYDROCHLOROTHIAZIDE 25 MG PO TABS: 25.0000 mg | ORAL_TABLET | Freq: Every day | ORAL | Status: DC

## 2016-04-24 NOTE — ED Provider Notes (Signed)
CSN: [id number]     Arrival date & time 04/24/16  1720 History   First MD Initiated Contact with Patient 04/24/16 1945     Chief Complaint  Patient presents with  . Medication Refill   (Consider location/radiation/quality/duration/timing/severity/associated sxs/prior Treatment) The history is provided by the patient. No language interpreter was used.  Patient with history hypertension, previously managed by Dr Roseanne Reno, whose practice closed.  She is without primary care doctor, will establish when her insurance starts in June.  Changed jobs recently.   Out ot her atenolol and HCTZ for 2 weeks.  Feels a little out of sorts since it ran out.   History of HTN since early 2000s.    ROS No fevers or chills, no chest pain or palpitations, no shortness of breath.   Social Hx Recently started new job, previously worked in [employer].  Smokes 1/2ppd cigarettes.   Past Medical History  Diagnosis Date  . Hypertension    Past Surgical History  Procedure Laterality Date  . Induced abortion     Family History  Problem Relation Age of Onset  . Other Neg Hx    Social History  Substance Use Topics  . Smoking status: Current Every Day Smoker -- 0.50 packs/day for 15 years  . Smokeless tobacco: None  . Alcohol Use: Yes     Comment: mix drink social   OB History    Gravida Para Term Preterm AB TAB SAB Ectopic Multiple Living   Review of Systems  Constitutional: Negative for fever, chills, diaphoresis and fatigue.  Respiratory: Negative for cough, chest tightness, shortness of breath and wheezing.   Cardiovascular: Negative for chest pain and palpitations.    Allergies  Review of patient's allergies indicates no known allergies.  Home Medications   Prior to Admission medications   Medication Sig Start Date End Date Taking? Authorizing Provider  albuterol (PROVENTIL HFA;VENTOLIN HFA) 108 (90 Base) MCG/ACT inhaler Inhale 1-2 puffs into the lungs every 6 (six)  hours as needed for wheezing or shortness of breath. 01/26/16   Tharon Aquas, PA  atenolol (TENORMIN) 25 MG tablet Take 1 tablet (25 mg total) by mouth daily. 04/24/16   Barbaraann Barthel, MD  famotidine (PEPCID) 20 MG tablet Take 1 tablet (20 mg total) by mouth 2 (two) times daily. X 5 days 04/10/15   Mathis Fare Presson, PA  hydrochlorothiazide (HYDRODIURIL) 25 MG tablet Take 1 tablet (25 mg total) by mouth daily. 04/24/16   Barbaraann Barthel, MD  hydrOXYzine (ATARAX/VISTARIL) 25 MG tablet Take 1 tablet (25 mg total) by mouth every 8 (eight) hours as needed for itching. 04/10/15   Ria Clock, PA  Multiple Vitamins-Minerals (MULTIVITAMIN WITH MINERALS) tablet Take 1 tablet by mouth daily.    Historical Provider, MD  mupirocin ointment (BACTROBAN) 2 % Apply to weeping areas on scalp BID x 7 days 04/10/15   Ria Clock, PA  predniSONE (DELTASONE) 10 MG tablet Take 2 tablets (20 mg total) by mouth daily. 06/29/15   Hayden Rasmussen, NP  triamcinolone cream (KENALOG) 0.1 % Apply 1 application topically 2 (two) times daily. 06/29/15   Hayden Rasmussen, NP   Meds Ordered and Administered this Visit  Medications - No data to display  BP 173/109 mmHg  Pulse 77  Temp(Src) 98.5 F (36.9 C) (Oral)  SpO2 100%  LMP 03/08/2016 (Exact Date) No data found.  Physical Exam  Constitutional: She appears well-developed and well-nourished. No distress.  HENT:  Head: Normocephalic and atraumatic.  Mouth/Throat: No oropharyngeal exudate.  Neck: Normal range of motion. Neck supple.  Cardiovascular: Normal rate, regular rhythm and normal heart sounds.   Pulmonary/Chest: Effort normal and breath sounds normal. No respiratory distress. She has no wheezes. She has no rales. She exhibits no tenderness.  Lymphadenopathy:    She has no cervical adenopathy.  Skin: She is not diaphoretic.    ED Course  Procedures (including critical care time)  Labs Review Labs Reviewed - No data to display  Imaging Review No  results found.   Visual Acuity Review  Right Eye Distance:   Left Eye Distance:   Bilateral Distance:    Right Eye Near:   Left Eye Near:    Bilateral Near:         MDM   1. Essential hypertension    HTN, essential. Out of meds, in need of new primary care doctor. Encouraged to establish. No worrisome elements on history or exam today. Explained need for longitudinal care, refills only to tide her over to see PCP.   LMP 10 days ago, patient reports that there is no chance she could be pregnant.   Paula ComptonJames Yeraldy Spike, MD    Barbaraann BarthelJames O Cordarrel Stiefel, MD 04/24/16 [phone]

## 2016-04-24 NOTE — Discharge Instructions (Signed)
It is a pleasure to see you today. As we discussed, I am refilling your medicines for hypertension.   It is extremely important that you re-establish care with a primary care doctor for management of your hypertension.   Return to urgent care center or emergency room with chest pain, palpitations, or other concerns.

## 2016-04-24 NOTE — ED Notes (Signed)
[facility] a [facility] of [facility] due to being out of her HTN meds for 2 weeks. The patient requested a refill on both.

## 2016-07-26 ENCOUNTER — Ambulatory Visit (HOSPITAL_COMMUNITY)
Admission: EM | Admit: 2016-07-26 | Discharge: 2016-07-26 | Disposition: A | Discharge status: Home / Self Care | Payer: Managed Care, Other (non HMO) | Attending: Emergency Medicine | Admitting: Emergency Medicine

## 2016-07-26 ENCOUNTER — Encounter (HOSPITAL_COMMUNITY): Payer: Self-pay | Admitting: Emergency Medicine

## 2016-07-26 DIAGNOSIS — I10 Essential (primary) hypertension: Secondary | ICD-10-CM

## 2016-07-26 DIAGNOSIS — Z760 Encounter for issue of repeat prescription: Secondary | ICD-10-CM

## 2016-07-26 MED ORDER — ATENOLOL 25 MG PO TABS: 25.0000 mg | ORAL_TABLET | Freq: Every day | ORAL | 2 refills | Status: DC

## 2016-07-26 MED ORDER — HYDROCHLOROTHIAZIDE 25 MG PO TABS: 25.0000 mg | ORAL_TABLET | Freq: Every day | ORAL | 2 refills | Status: DC

## 2016-07-26 NOTE — ED Provider Notes (Signed)
[facility]    CSN: [id number] Arrival date & time: 07/26/16  1700  First Provider Contact:  First MD Initiated Contact with Patient 07/26/16 1811        History   Chief Complaint Chief Complaint  Patient presents with  . Hypertension  . Medication Refill    HPI Debbie Ball is a 50 y.o. female.   She is a 50 year old woman here for medication refill. She has a history of hypertension and has been out of her medication for the last 5 days. She states her blood pressure has been elevated for the last 5 days. When she is taking her medication, it is well-controlled. She reports a mild headache and some mild leg swelling. She denies any vision changes. No chest pain or shortness of breath. She is in the process of finding a new primary care doctor.    Past Medical History:  Diagnosis Date  . Hypertension     There are no active problems to display for this patient.   Past Surgical History:  Procedure Laterality Date  . INDUCED ABORTION      OB History    Gravida Para Term Preterm AB Living   4 2 2   1 2    SAB TAB Ectopic Multiple Live Births     1             Home Medications    Prior to Admission medications   Medication Sig Start Date End Date Taking? Authorizing Provider  albuterol (PROVENTIL HFA;VENTOLIN HFA) 108 (90 Base) MCG/ACT inhaler Inhale 1-2 puffs into the lungs every 6 (six) hours as needed for wheezing or shortness of breath. 01/26/16   Tharon Aquas, PA  atenolol (TENORMIN) 25 MG tablet Take 1 tablet (25 mg total) by mouth daily. 07/26/16   Charm Rings, MD  famotidine (PEPCID) 20 MG tablet Take 1 tablet (20 mg total) by mouth 2 (two) times daily. X 5 days 04/10/15   Mathis Fare Presson, PA  hydrochlorothiazide (HYDRODIURIL) 25 MG tablet Take 1 tablet (25 mg total) by mouth daily. 07/26/16   Charm Rings, MD  hydrOXYzine (ATARAX/VISTARIL) 25 MG tablet Take 1 tablet (25 mg total) by mouth every 8 (eight) hours as needed for itching. 04/10/15    Ria Clock, PA  Multiple Vitamins-Minerals (MULTIVITAMIN WITH MINERALS) tablet Take 1 tablet by mouth daily.    Historical Provider, MD  mupirocin ointment (BACTROBAN) 2 % Apply to weeping areas on scalp BID x 7 days 04/10/15   Ria Clock, PA  predniSONE (DELTASONE) 10 MG tablet Take 2 tablets (20 mg total) by mouth daily. 06/29/15   Hayden Rasmussen, NP  triamcinolone cream (KENALOG) 0.1 % Apply 1 application topically 2 (two) times daily. 06/29/15   Hayden Rasmussen, NP    Family History Family History  Problem Relation Age of Onset  . Other Neg Hx     Social History Social History  Substance Use Topics  . Smoking status: Current Every Day Smoker    Packs/day: 0.50    Years: 15.00  . Smokeless tobacco: Not on file  . Alcohol use Yes     Comment: mix drink social     Allergies   Review of patient's allergies indicates no known allergies.   Review of Systems Review of Systems  Eyes: Negative for visual disturbance.  Respiratory: Negative for shortness of breath.   Cardiovascular: Positive for leg swelling. Negative for chest pain.  Neurological: Positive  for headaches.     Physical Exam Triage Vital Signs ED Triage Vitals  Enc Vitals Group     BP 07/26/16 1736 164/92     Pulse Rate 07/26/16 1736 86     Resp --      Temp 07/26/16 1736 99.3 F (37.4 C)     Temp Source 07/26/16 1736 Oral     SpO2 07/26/16 1736 100 %     Weight --      Height --      Head Circumference --      Peak Flow --      Pain Score 07/26/16 1839 3     Pain Loc --      Pain Edu? --      Excl. in GC? --    No data found.   Updated Vital Signs BP 164/92 (BP Location: Left Arm)   Pulse 86   Temp 99.3 F (37.4 C) (Oral)   LMP 07/19/2016   SpO2 100%   Visual Acuity Right Eye Distance:   Left Eye Distance:   Bilateral Distance:    Right Eye Near:   Left Eye Near:    Bilateral Near:     Physical Exam  Constitutional: She is oriented to person, place, and time. She  appears well-developed and well-nourished. No distress.  Cardiovascular: Normal rate, regular rhythm and normal heart sounds.  Exam reveals no gallop.   No murmur heard. Pulmonary/Chest: Effort normal.  Musculoskeletal: She exhibits edema (trace in bilateral ankles).  Neurological: She is alert and oriented to person, place, and time.     UC Treatments / Results  Labs (all labs ordered are listed, but only abnormal results are displayed) Labs Reviewed - No data to display  EKG  EKG Interpretation None       Radiology No results found.  Procedures Procedures (including critical care time)  Medications Ordered in UC Medications - No data to display   Initial Impression / Assessment and Plan / UC Course  I have reviewed the triage vital signs and the nursing notes.  Pertinent labs & imaging results that were available during my care of the patient were reviewed by me and considered in my medical decision making (see chart for details).  Clinical Course    I provided a 3 month supply of her blood pressure medications. She will continue to work on finding a new PCP. Follow-up as needed.  I have verbally reviewed the discharge instructions with the patient. A printed AVS was given to the patient.  All questions were answered prior to discharge.    Final Clinical Impressions(s) / UC Diagnoses   Final diagnoses:  Essential hypertension  Medication refill    New Prescriptions Current Discharge Medication List       Charm Rings, MD 07/26/16 1851

## 2016-07-26 NOTE — ED Triage Notes (Signed)
PT has been out of BP meds for approx five days. BP is elevated. PT reports she feels sluggish and has headaches without BP meds.

## 2016-07-26 NOTE — Discharge Instructions (Signed)
I provided a 3 month supply of your blood pressure medicine. Please continue to work on finding a new primary care doctor. Follow-up as needed.

## 2016-07-26 NOTE — ED Notes (Signed)
PT dsicharged by Dr. Piedad Climes

## 2017-01-18 ENCOUNTER — Emergency Department (HOSPITAL_COMMUNITY)
Admission: EM | Admit: 2017-01-18 | Discharge: 2017-01-18 | Disposition: A | Discharge status: Home / Self Care | Payer: PRIVATE HEALTH INSURANCE | Attending: Emergency Medicine | Admitting: Emergency Medicine

## 2017-01-18 ENCOUNTER — Encounter (HOSPITAL_COMMUNITY): Payer: Self-pay

## 2017-01-18 DIAGNOSIS — K0889 Other specified disorders of teeth and supporting structures: Secondary | ICD-10-CM | POA: Insufficient documentation

## 2017-01-18 DIAGNOSIS — F17200 Nicotine dependence, unspecified, uncomplicated: Secondary | ICD-10-CM | POA: Diagnosis not present

## 2017-01-18 DIAGNOSIS — Z79899 Other long term (current) drug therapy: Secondary | ICD-10-CM | POA: Insufficient documentation

## 2017-01-18 DIAGNOSIS — I10 Essential (primary) hypertension: Secondary | ICD-10-CM | POA: Insufficient documentation

## 2017-01-18 MED ORDER — NAPROXEN 500 MG PO TABS: 500.0000 mg | ORAL_TABLET | Freq: Two times a day (BID) | ORAL | 0 refills | Status: DC

## 2017-01-18 MED ORDER — ACETAMINOPHEN 500 MG PO TABS: 1000.0000 mg | ORAL_TABLET | Freq: Four times a day (QID) | ORAL | 0 refills | Status: DC | PRN

## 2017-01-18 MED ORDER — PENICILLIN V POTASSIUM 500 MG PO TABS: 500.0000 mg | ORAL_TABLET | Freq: Four times a day (QID) | ORAL | 0 refills | Status: AC

## 2017-01-18 NOTE — ED Provider Notes (Signed)
[facility] Note   CSN: [id number] Arrival date & time: 01/18/17  4098  By signing my name below, I, Orpah Cobb, attest that this documentation has been prepared under the direction and in the presence of Buel Ream, PA-C. Electronically Signed: Orpah Cobb , ED Scribe. 01/06/17. 4:20 PM.  History   Chief Complaint No chief complaint on file.   HPI HPI Comments: Debbie Ball is a 51 y.o. female with hx of HTN who presents to the Emergency Department complaining of mild to moderate chronic dental pain with sudden onset x1 week. Pt states that for the past x1 week she has had L upper dental pain that radiates to the L side of the head. Pt reports L sided headache, nausea. Pt has taken Motrin with no relief. Pt denies fever, chest pain, abdominal pain, vomiting, SOB. Of note, pt does not have a dentist but reports having insurance. Pt denies known allergies to medication.   The history is provided by the patient. A language interpreter was used.    Past Medical History:  Diagnosis Date  . Hypertension     There are no active problems to display for this patient.   Past Surgical History:  Procedure Laterality Date  . INDUCED ABORTION      OB History    Gravida Para Term Preterm AB Living   4 2 2   1 2    SAB TAB Ectopic Multiple Live Births     1             Home Medications    Prior to Admission medications   Medication Sig Start Date End Date Taking? Authorizing Provider  acetaminophen (TYLENOL) 500 MG tablet Take 2 tablets (1,000 mg total) by mouth every 6 (six) hours as needed. 01/18/17   Emi Holes, PA-C  albuterol (PROVENTIL HFA;VENTOLIN HFA) 108 (90 Base) MCG/ACT inhaler Inhale 1-2 puffs into the lungs every 6 (six) hours as needed for wheezing or shortness of breath. 01/26/16   Tharon Aquas, PA  atenolol (TENORMIN) 25 MG tablet Take 1 tablet (25 mg total) by mouth daily. 07/26/16   Charm Rings, MD  famotidine (PEPCID) 20 MG  tablet Take 1 tablet (20 mg total) by mouth 2 (two) times daily. X 5 days 04/10/15   Mathis Fare Presson, PA  hydrochlorothiazide (HYDRODIURIL) 25 MG tablet Take 1 tablet (25 mg total) by mouth daily. 07/26/16   Charm Rings, MD  hydrOXYzine (ATARAX/VISTARIL) 25 MG tablet Take 1 tablet (25 mg total) by mouth every 8 (eight) hours as needed for itching. 04/10/15   Ria Clock, PA  Multiple Vitamins-Minerals (MULTIVITAMIN WITH MINERALS) tablet Take 1 tablet by mouth daily.    Historical Provider, MD  mupirocin ointment (BACTROBAN) 2 % Apply to weeping areas on scalp BID x 7 days 04/10/15   Ria Clock, PA  naproxen (NAPROSYN) 500 MG tablet Take 1 tablet (500 mg total) by mouth 2 (two) times daily. 01/18/17   Emi Holes, PA-C  penicillin v potassium (VEETID) 500 MG tablet Take 1 tablet (500 mg total) by mouth 4 (four) times daily. 01/18/17 01/25/17  Waylan Boga Hollin Crewe, PA-C  predniSONE (DELTASONE) 10 MG tablet Take 2 tablets (20 mg total) by mouth daily. 06/29/15   Hayden Rasmussen, NP  triamcinolone cream (KENALOG) 0.1 % Apply 1 application topically 2 (two) times daily. 06/29/15   Hayden Rasmussen, NP    Family History Family History  Problem Relation Age of  Onset  . Other Neg Hx     Social History Social History  Substance Use Topics  . Smoking status: Current Every Day Smoker    Packs/day: 0.50    Years: 15.00  . Smokeless tobacco: Not on file  . Alcohol use Yes     Comment: mix drink social     Allergies   Patient has no known allergies.   Review of Systems Review of Systems  Constitutional: Negative for fever.  HENT: Positive for dental problem.   Respiratory: Negative for shortness of breath.   Cardiovascular: Negative for chest pain.  Gastrointestinal: Positive for nausea. Negative for vomiting.     Physical Exam Updated Vital Signs BP 141/94 (BP Location: Right Arm)   Pulse 72   Temp 99.2 F (37.3 C) (Oral)   Resp 16   SpO2 100%   Physical Exam    Constitutional: She appears well-developed and well-nourished. No distress.  HENT:  Head: Normocephalic and atraumatic.  Mouth/Throat: Oropharynx is clear and moist. No trismus in the jaw. Normal dentition. Dental caries present. No dental abscesses. No oropharyngeal exudate, posterior oropharyngeal edema or posterior oropharyngeal erythema.    Eyes: Conjunctivae are normal. Pupils are equal, round, and reactive to light. Right eye exhibits no discharge. Left eye exhibits no discharge. No scleral icterus.  Neck: Normal range of motion. Neck supple. No thyromegaly present.  Cardiovascular: Normal rate, regular rhythm, normal heart sounds and intact distal pulses.  Exam reveals no gallop and no friction rub.   No murmur heard. Pulmonary/Chest: Effort normal and breath sounds normal. No stridor. No respiratory distress. She has no wheezes. She has no rales.  Abdominal: Soft. Bowel sounds are normal. She exhibits no distension. There is no tenderness. There is no rebound and no guarding.  Musculoskeletal: She exhibits no edema.  Lymphadenopathy:    She has no cervical adenopathy.  Neurological: She is alert. Coordination normal.  Skin: Skin is warm and dry. No rash noted. She is not diaphoretic. No pallor.  Psychiatric: She has a normal mood and affect.  Nursing note and vitals reviewed.    ED Treatments / Results   DIAGNOSTIC STUDIES: Oxygen Saturation is 100% on RA, normal by my interpretation.   COORDINATION OF CARE: 12:37 PM-Discussed next steps with pt. Pt verbalized understanding and is agreeable with the plan.    Labs (all labs ordered are listed, but only abnormal results are displayed) Labs Reviewed - No data to display  EKG  EKG Interpretation None       Radiology No results found.  Procedures Procedures (including critical care time)  Medications Ordered in ED Medications - No data to display   Initial Impression / Assessment and Plan / ED Course  I  have reviewed the triage vital signs and the nursing notes.  Pertinent labs & imaging results that were available during my care of the patient were reviewed by me and considered in my medical decision making (see chart for details).     Patient with dentalgia.  No abscess requiring immediate incision and drainage.  Exam not concerning for Ludwig's angina or pharyngeal abscess.  Will treat with Penicillin, Naprosyn, Tylenol. Pt instructed to follow-up with dentist, given resources.  Discussed return precautions. Patient vitals stable throughout ED course and discharged in satisfactory condition.  Final Clinical Impressions(s) / ED Diagnoses   Final diagnoses:  Pain, dental    New Prescriptions Discharge Medication List as of 01/18/2017  9:40 AM    START taking these medications  Details  acetaminophen (TYLENOL) 500 MG tablet Take 2 tablets (1,000 mg total) by mouth every 6 (six) hours as needed., Starting Sat 01/18/2017, Print    naproxen (NAPROSYN) 500 MG tablet Take 1 tablet (500 mg total) by mouth 2 (two) times daily., Starting Sat 01/18/2017, Print    penicillin v potassium (VEETID) 500 MG tablet Take 1 tablet (500 mg total) by mouth 4 (four) times daily., Starting Sat 01/18/2017, Until Sat 01/25/2017, Print       I personally performed the services described in this documentation, which was scribed in my presence. The recorded information has been reviewed and is accurate.     Emi Holes, PA-C 01/18/17 1237    Alvira Monday, MD 01/21/17 [phone]

## 2017-01-18 NOTE — Discharge Instructions (Signed)
Medications: Penicillin, Naprosyn, Tylenol  Treatment: Take penicillin 4 times daily for 7 days. Make sure to finish all this medication. Take Naprosyn twice daily for your pain. You can also take Tylenol as prescribed every 6 hours.  Follow-up: Please follow-up with a dentist as soon as possible for further shin and treatment of your dental pain. Please return to emergency department if you develop any new or worsening symptoms including increasing swelling, fevers, or masses in your neck.

## 2017-01-18 NOTE — ED Triage Notes (Signed)
Patient here with left upper dental pain that has gotten worse this week, states she has several teeth that need to be pulled. NAD

## 2017-02-22 ENCOUNTER — Ambulatory Visit (HOSPITAL_COMMUNITY)
Admission: EM | Admit: 2017-02-22 | Discharge: 2017-02-22 | Disposition: A | Discharge status: Home / Self Care | Payer: PRIVATE HEALTH INSURANCE | Attending: Internal Medicine | Admitting: Internal Medicine

## 2017-02-22 ENCOUNTER — Encounter (HOSPITAL_COMMUNITY): Payer: Self-pay | Admitting: Emergency Medicine

## 2017-02-22 DIAGNOSIS — K047 Periapical abscess without sinus: Secondary | ICD-10-CM | POA: Diagnosis not present

## 2017-02-22 MED ORDER — IBUPROFEN 800 MG PO TABS: 800.0000 mg | ORAL_TABLET | Freq: Three times a day (TID) | ORAL | 0 refills | Status: DC

## 2017-02-22 MED ORDER — CLINDAMYCIN HCL 150 MG PO CAPS: 150.0000 mg | ORAL_CAPSULE | Freq: Three times a day (TID) | ORAL | 0 refills | Status: DC

## 2017-02-22 MED ORDER — HYDROCODONE-ACETAMINOPHEN 5-325 MG PO TABS: 1.0000 | ORAL_TABLET | ORAL | 0 refills | Status: DC | PRN

## 2017-02-22 NOTE — Discharge Instructions (Addendum)
For your dental pain, have provided a handout to a low cost dentists that offers extraction services. I have prescribed an antibiotic called Clindamycin, take 1 tablet 3 times a day for 7 days. For pain, I have prescribed ibuprofen 800 mg, 1 tablet every 8 hours around the clock. For break through pain I have prescribed hydrocodone. This medicine is addictive, this medicine is a narcotic, it will cause drowsiness, do not drink any alcohol, do not drive while taking. Take 1-2 tablets every 4 hours as needed for breakthrough pain. For definitive, curative treatment, you will need to go to a dentist.

## 2017-02-22 NOTE — ED Provider Notes (Signed)
CSN: [id number]     Arrival date & time 02/22/17  1314 History   None    Chief Complaint  Patient presents with  . Dental Pain   (Consider location/radiation/quality/duration/timing/severity/associated sxs/prior Treatment) 51 year old female presents with a one-week history of upper left-sided dental pain, states her pain significantly worsened last night, states she feels swelling, and pain. She denies fever or chills, no nausea, no vomiting, has had difficulty chewing, and little to no tolerance of her cold fluids. She states she's been trying to find a dentist that can treat her, with little success.   The history is provided by the patient.    Past Medical History:  Diagnosis Date  . Hypertension    Past Surgical History:  Procedure Laterality Date  . INDUCED ABORTION     Family History  Problem Relation Age of Onset  . Other Neg Hx    Social History  Substance Use Topics  . Smoking status: Current Every Day Smoker    Packs/day: 0.40    Years: 15.00  . Smokeless tobacco: Never Used  . Alcohol use Yes     Comment: mix drink social   OB History    Gravida Para Term Preterm AB Living   4 2 2   1 2    SAB TAB Ectopic Multiple Live Births     1           Review of Systems  Reason unable to perform ROS: As covered in history of present illness.  All other systems reviewed and are negative.   Allergies  Patient has no known allergies.  Home Medications   Prior to Admission medications   Medication Sig Start Date End Date Taking? Authorizing Provider  atenolol (TENORMIN) 25 MG tablet Take 1 tablet (25 mg total) by mouth daily. 07/26/16  Yes Charm Rings, MD  hydrochlorothiazide (HYDRODIURIL) 25 MG tablet Take 1 tablet (25 mg total) by mouth daily. 07/26/16  Yes Charm Rings, MD  acetaminophen (TYLENOL) 500 MG tablet Take 2 tablets (1,000 mg total) by mouth every 6 (six) hours as needed. 01/18/17   Emi Holes, PA-C  albuterol (PROVENTIL HFA;VENTOLIN HFA) 108 (90  Base) MCG/ACT inhaler Inhale 1-2 puffs into the lungs every 6 (six) hours as needed for wheezing or shortness of breath. 01/26/16   Tharon Aquas, PA  clindamycin (CLEOCIN) 150 MG capsule Take 1 capsule (150 mg total) by mouth 3 (three) times daily. 02/22/17   Dorena Bodo, NP  famotidine (PEPCID) 20 MG tablet Take 1 tablet (20 mg total) by mouth 2 (two) times daily. X 5 days 04/10/15   Ria Clock, PA  HYDROcodone-acetaminophen (NORCO/VICODIN) 5-325 MG tablet Take 1-2 tablets by mouth every 4 (four) hours as needed. 02/22/17   Dorena Bodo, NP  hydrOXYzine (ATARAX/VISTARIL) 25 MG tablet Take 1 tablet (25 mg total) by mouth every 8 (eight) hours as needed for itching. 04/10/15   Mathis Fare Presson, PA  ibuprofen (ADVIL,MOTRIN) 800 MG tablet Take 1 tablet (800 mg total) by mouth 3 (three) times daily. 02/22/17   Dorena Bodo, NP  Multiple Vitamins-Minerals (MULTIVITAMIN WITH MINERALS) tablet Take 1 tablet by mouth daily.    Historical Provider, MD  mupirocin ointment (BACTROBAN) 2 % Apply to weeping areas on scalp BID x 7 days 04/10/15   Ria Clock, PA  naproxen (NAPROSYN) 500 MG tablet Take 1 tablet (500 mg total) by mouth 2 (two) times daily. 01/18/17   Emi Holes, PA-C  predniSONE (DELTASONE) 10 MG tablet Take 2 tablets (20 mg total) by mouth daily. 06/29/15   Hayden Rasmussenavid Mabe, NP  triamcinolone cream (KENALOG) 0.1 % Apply 1 application topically 2 (two) times daily. 06/29/15   Hayden Rasmussenavid Mabe, NP   Meds Ordered and Administered this Visit  Medications - No data to display  BP 159/80   Pulse 71   Temp 98.7 F (37.1 C) (Oral)   Resp 16   Ht 5\' 6"  (1.676 m)   Wt 218 lb (98.9 kg)   LMP 02/13/2017   SpO2 98%   BMI 35.19 kg/m  No data found.   Physical Exam  Constitutional: She appears well-developed and well-nourished. No distress.  HENT:  Head: Normocephalic and atraumatic.  Right Ear: External ear normal.  Left Ear: External ear normal.  Mouth/Throat: Oropharynx  is clear and moist.    Eyes: EOM are normal. Pupils are equal, round, and reactive to light.  Neck: Normal range of motion. Neck supple. No JVD present.  Cardiovascular: Normal rate and regular rhythm.   Pulmonary/Chest: Effort normal and breath sounds normal.  Lymphadenopathy:       Head (right side): No submental, no submandibular, no tonsillar and no preauricular adenopathy present.       Head (left side): No submental, no submandibular, no tonsillar and no preauricular adenopathy present.    She has no cervical adenopathy.  Neurological: She is alert.  Skin: Skin is warm and dry. Capillary refill takes less than 2 seconds. She is not diaphoretic.  Psychiatric: She has a normal mood and affect.  Nursing note and vitals reviewed.   Urgent Care Course     Procedures (including critical care time)  Labs Review Labs Reviewed - No data to display  Imaging Review No results found.      MDM   1. Dental infection    For your dental pain, have provided a handout to a low cost dentists that offers extraction services. I have prescribed an antibiotic called Clindamycin, take 1 tablet 3 times a day for 7 days. For pain, I have prescribed ibuprofen 800 mg, 1 tablet every 8 hours around the clock. For break through pain I have prescribed hydrocodone. This medicine is addictive, this medicine is a narcotic, it will cause drowsiness, do not drink any alcohol, do not drive while taking. Take 1-2 tablets every 4 hours as needed for breakthrough pain. For definitive, curative treatment, you will need to go to a dentist.      Dorena BodoLawrence Marlean Mortell, NP 02/22/17 1707

## 2017-02-22 NOTE — ED Triage Notes (Signed)
PT reports she has left sided dental pain for a few weeks. PT reports pain got significantly worse last night. PT reports facial swelling on left side as well. PT is trying to find a dentist

## 2017-10-12 ENCOUNTER — Emergency Department (HOSPITAL_COMMUNITY)
Admission: EM | Admit: 2017-10-12 | Discharge: 2017-10-12 | Disposition: A | Discharge status: Home / Self Care | Payer: PRIVATE HEALTH INSURANCE | Attending: Emergency Medicine | Admitting: Emergency Medicine

## 2017-10-12 ENCOUNTER — Encounter (HOSPITAL_COMMUNITY): Payer: Self-pay

## 2017-10-12 DIAGNOSIS — I10 Essential (primary) hypertension: Secondary | ICD-10-CM | POA: Diagnosis not present

## 2017-10-12 DIAGNOSIS — Z79899 Other long term (current) drug therapy: Secondary | ICD-10-CM | POA: Insufficient documentation

## 2017-10-12 DIAGNOSIS — K047 Periapical abscess without sinus: Secondary | ICD-10-CM | POA: Diagnosis not present

## 2017-10-12 DIAGNOSIS — R220 Localized swelling, mass and lump, head: Secondary | ICD-10-CM | POA: Diagnosis present

## 2017-10-12 DIAGNOSIS — F17210 Nicotine dependence, cigarettes, uncomplicated: Secondary | ICD-10-CM | POA: Diagnosis not present

## 2017-10-12 MED ORDER — NAPROXEN 500 MG PO TABS: 500.0000 mg | ORAL_TABLET | Freq: Two times a day (BID) | ORAL | 0 refills | Status: DC

## 2017-10-12 MED ORDER — CLINDAMYCIN HCL 150 MG PO CAPS: 150.0000 mg | ORAL_CAPSULE | Freq: Four times a day (QID) | ORAL | 0 refills | Status: DC

## 2017-10-12 MED ORDER — NAPROXEN 250 MG PO TABS
500.0000 mg | ORAL_TABLET | Freq: Once | ORAL | Status: AC
  Administered 2017-10-12: 500 mg via ORAL
  Filled 2017-10-12: qty 2

## 2017-10-12 NOTE — Discharge Instructions (Signed)
You were seen today for dental infection and facial swelling. You need to follow-up closely on Monday with her dentist. Take clindamycin for infection. Naproxen twice daily. He may apply ice. If you develop increasing swelling, fevers or any new worsening symptoms you need to be reevaluated.

## 2017-10-12 NOTE — ED Provider Notes (Signed)
[facility]   CSN: [id number] Arrival date & time: 10/12/17  1054     History   Chief Complaint No chief complaint on file.   HPI Debbie Ball is a 51 y.o. female.  HPI  This 51 year old female with history of hypertension who presents with right-sided dental pain and facial swelling. Patient reports onset of symptoms 2 days ago. She states that she had "severe" pain yesterday at 10 out of 10. Today her pain is 5 out of 10; however, she has noted some facial swelling which concerned her. No fevers. She denies any difficulty swallowing. She does report recent upper respiratory infection for which she is taking a course of amoxicillin. She has 2 days left. She has had to have multiple teeth extracted and does have a dentist.  Past Medical History:  Diagnosis Date  . Hypertension     There are no active problems to display for this patient.   Past Surgical History:  Procedure Laterality Date  . INDUCED ABORTION      OB History    Gravida Para Term Preterm AB Living   4 2 2   1 2    SAB TAB Ectopic Multiple Live Births     1             Home Medications    Prior to Admission medications   Medication Sig Start Date End Date Taking? Authorizing Provider  acetaminophen (TYLENOL) 500 MG tablet Take 2 tablets (1,000 mg total) by mouth every 6 (six) hours as needed. 01/18/17   Law, Waylan BogaAlexandra M, PA-C  albuterol (PROVENTIL HFA;VENTOLIN HFA) 108 (90 Base) MCG/ACT inhaler Inhale 1-2 puffs into the lungs every 6 (six) hours as needed for wheezing or shortness of breath. 01/26/16   Tharon AquasPatrick, Frank C, PA  atenolol (TENORMIN) 25 MG tablet Take 1 tablet (25 mg total) by mouth daily. 07/26/16   Charm RingsHonig, Erin J, MD  clindamycin (CLEOCIN) 150 MG capsule Take 1 capsule (150 mg total) by mouth 4 (four) times daily. 10/12/17   Horton, Mayer Maskerourtney F, MD  famotidine (PEPCID) 20 MG tablet Take 1 tablet (20 mg total) by mouth 2 (two) times daily. X 5  days 04/10/15   Ria ClockPresson, Jennifer Lee H, PA  hydrochlorothiazide (HYDRODIURIL) 25 MG tablet Take 1 tablet (25 mg total) by mouth daily. 07/26/16   Charm RingsHonig, Erin J, MD  HYDROcodone-acetaminophen (NORCO/VICODIN) 5-325 MG tablet Take 1-2 tablets by mouth every 4 (four) hours as needed. 02/22/17   Dorena BodoKennard, Lawrence, NP  hydrOXYzine (ATARAX/VISTARIL) 25 MG tablet Take 1 tablet (25 mg total) by mouth every 8 (eight) hours as needed for itching. 04/10/15   Presson, Mathis FareJennifer Lee H, PA  ibuprofen (ADVIL,MOTRIN) 800 MG tablet Take 1 tablet (800 mg total) by mouth 3 (three) times daily. 02/22/17   Dorena BodoKennard, Lawrence, NP  Multiple Vitamins-Minerals (MULTIVITAMIN WITH MINERALS) tablet Take 1 tablet by mouth daily.    [provider]  mupirocin ointment (BACTROBAN) 2 % Apply to weeping areas on scalp BID x 7 days 04/10/15   Ria ClockPresson, Jennifer Lee H, PA  naproxen (NAPROSYN) 500 MG tablet Take 1 tablet (500 mg total) by mouth 2 (two) times daily. 10/12/17   Horton, Mayer Maskerourtney F, MD  predniSONE (DELTASONE) 10 MG tablet Take 2 tablets (20 mg total) by mouth daily. 06/29/15   Hayden RasmussenMabe, David, NP  triamcinolone cream (KENALOG) 0.1 % Apply 1 application topically 2 (two) times daily. 06/29/15   Hayden RasmussenMabe, David, NP  Family History Family History  Problem Relation Age of Onset  . Other Neg Hx     Social History Social History  Substance Use Topics  . Smoking status: Current Every Day Smoker    Packs/day: 0.40    Years: 15.00  . Smokeless tobacco: Never Used  . Alcohol use Yes     Comment: mix drink social     Allergies   Patient has no known allergies.   Review of Systems Review of Systems  Constitutional: Negative for fever.  HENT: Positive for congestion and dental problem. Negative for trouble swallowing and voice change.   Respiratory: Negative for shortness of breath.   Cardiovascular: Negative for chest pain.  All other systems reviewed and are negative.    Physical Exam Updated Vital Signs BP (!)  150/95 (BP Location: Right Arm)   Pulse 79   Temp 99 F (37.2 C) (Oral)   Resp 16   Ht 5\' 6"  (1.676 m)   Wt 100.2 kg (221 lb)   SpO2 100%   BMI 35.67 kg/m   Physical Exam  Constitutional: She is oriented to person, place, and time. She appears well-developed and well-nourished. No distress.  HENT:  Head: Normocephalic and atraumatic.  Mild swelling of the right cheek extending just over the zygomatic arch, soft, no overlying skin changes, no induration Generally poor dentition with periodontal disease and recessed gums, there is tenderness to palpation over tooth # 5 without palpable abscess  Neck: Normal range of motion. Neck supple.  Cardiovascular: Normal rate, regular rhythm and normal heart sounds.   Pulmonary/Chest: Effort normal. No respiratory distress. She has no wheezes.  Neurological: She is alert and oriented to person, place, and time.  Skin: Skin is warm and dry.  Psychiatric: She has a normal mood and affect.  Nursing note and vitals reviewed.    ED Treatments / Results  Labs (all labs ordered are listed, but only abnormal results are displayed) Labs Reviewed - No data to display  EKG  EKG Interpretation None       Radiology No results found.  Procedures Procedures (including critical care time)  Medications Ordered in ED Medications  naproxen (NAPROSYN) tablet 500 mg (not administered)     Initial Impression / Assessment and Plan / ED Course  I have reviewed the triage vital signs and the nursing notes.  Pertinent labs & imaging results that were available during my care of the patient were reviewed by me and considered in my medical decision making (see chart for details).     Patient presents with facial swelling and concern for dental infection. She is nontoxic-appearing on exam. Vital signs are reassuring. She has mild facial swelling extending just over the zygomatic arch without overlying skin changes or induration. It is soft. She has  generally poor dentition but no palpable abscess. Unfortunately, she is on amoxicillin which is not ideal for dental infection but is within reason to treat a dental infection. At this time I did not have any indication for deep space or drainable abscess. Will transition to clindamycin. Naproxen as needed for pain. Call dentist on Monday. If swelling increases or she gets overlying skin changes, fevers, or any new or worsening symptoms she was advised to return immediately.  After history, exam, and medical workup I feel the patient has been appropriately medically screened and is safe for discharge home. Pertinent diagnoses were discussed with the patient. Patient was given return precautions.   Final Clinical Impressions(s) / ED Diagnoses  Final diagnoses:  Dental infection  Facial swelling    New Prescriptions New Prescriptions   CLINDAMYCIN (CLEOCIN) 150 MG CAPSULE    Take 1 capsule (150 mg total) by mouth 4 (four) times daily.   NAPROXEN (NAPROSYN) 500 MG TABLET    Take 1 tablet (500 mg total) by mouth 2 (two) times daily.     Shon Baton, MD 10/12/17 [id number]

## 2017-10-12 NOTE — ED Triage Notes (Signed)
Patient complains of right upper gum pain and swelling x 1 day, taking ibuprofen with some relief, NAD

## 2019-01-11 ENCOUNTER — Emergency Department (HOSPITAL_BASED_OUTPATIENT_CLINIC_OR_DEPARTMENT_OTHER): Payer: Managed Care, Other (non HMO)

## 2019-01-11 ENCOUNTER — Emergency Department (HOSPITAL_COMMUNITY)
Admission: EM | Admit: 2019-01-11 | Discharge: 2019-01-11 | Disposition: A | Discharge status: Home / Self Care | Payer: Managed Care, Other (non HMO) | Attending: Emergency Medicine | Admitting: Emergency Medicine

## 2019-01-11 ENCOUNTER — Encounter (HOSPITAL_COMMUNITY): Payer: Self-pay

## 2019-01-11 DIAGNOSIS — M79604 Pain in right leg: Secondary | ICD-10-CM | POA: Insufficient documentation

## 2019-01-11 DIAGNOSIS — R52 Pain, unspecified: Secondary | ICD-10-CM | POA: Diagnosis not present

## 2019-01-11 DIAGNOSIS — Z79899 Other long term (current) drug therapy: Secondary | ICD-10-CM | POA: Insufficient documentation

## 2019-01-11 DIAGNOSIS — I10 Essential (primary) hypertension: Secondary | ICD-10-CM | POA: Insufficient documentation

## 2019-01-11 DIAGNOSIS — M5431 Sciatica, right side: Secondary | ICD-10-CM

## 2019-01-11 DIAGNOSIS — F17210 Nicotine dependence, cigarettes, uncomplicated: Secondary | ICD-10-CM | POA: Diagnosis not present

## 2019-01-11 MED ORDER — CYCLOBENZAPRINE HCL 10 MG PO TABS: 10.0000 mg | ORAL_TABLET | Freq: Three times a day (TID) | ORAL | 0 refills | Status: DC | PRN

## 2019-01-11 MED ORDER — TRAMADOL HCL 50 MG PO TABS: 50.0000 mg | ORAL_TABLET | Freq: Four times a day (QID) | ORAL | 0 refills | Status: DC | PRN

## 2019-01-11 MED ORDER — KETOROLAC TROMETHAMINE 60 MG/2ML IM SOLN
60.0000 mg | Freq: Once | INTRAMUSCULAR | Status: AC
  Administered 2019-01-11: 60 mg via INTRAMUSCULAR
  Filled 2019-01-11: qty 2

## 2019-01-11 MED ORDER — PREDNISONE 50 MG PO TABS: 50.0000 mg | ORAL_TABLET | Freq: Every day | ORAL | 0 refills | Status: DC

## 2019-01-11 NOTE — ED Notes (Signed)
Patient transported to Ultrasound 

## 2019-01-11 NOTE — ED Notes (Signed)
Patient verbalizes understanding of discharge instructions. Opportunity for questioning and answers were provided. Armband removed by staff, pt discharged from ED ambulatory.   

## 2019-01-11 NOTE — Discharge Instructions (Addendum)
Return here as needed.  Your ultrasound did not show any signs of blood clot.  Follow-up with your primary doctor.

## 2019-01-11 NOTE — Progress Notes (Signed)
Right lower extremity venous duplex completed. Refer to "CV Proc" under chart review to view preliminary results.  01/11/2019 11:25 AM Gertie Fey, MHA, RVT, RDCS, RDMS

## 2019-01-11 NOTE — ED Triage Notes (Signed)
Pt presents for evaluation of R leg pain with radiation down posterior leg. Denies injury or fall. Reports stands a lot for work.

## 2019-01-12 NOTE — ED Provider Notes (Addendum)
[facility] EMERGENCY DEPARTMENT Provider Note   CSN: [id number] Arrival date & time: 01/11/19  5621     History   Chief Complaint Chief Complaint  Patient presents with  . Leg Pain    HPI Debbie Ball is a 53 y.o. female.  HPI Patient presents to the emergency department with right leg pain that radiates from her right buttocks down to her toes.  The patient states this started 3 days ago.  The patient states that nothing seems to make the condition better but palpation does make the posterior leg pain worse.  Patient states that she did not take any medications prior to arrival for her symptoms.  Patient states that she is not had any injuries that she is aware of.  The patient denies chest pain, shortness of breath, headache,blurred vision, neck pain, fever, cough, weakness, numbness, dizziness, anorexia, edema, abdominal pain, nausea, vomiting, diarrhea, rash, back pain, dysuria, hematemesis, bloody stool, near syncope, or syncope. Past Medical History:  Diagnosis Date  . Hypertension     There are no active problems to display for this patient.   Past Surgical History:  Procedure Laterality Date  . INDUCED ABORTION       OB History    Gravida  4   Para  2   Term  2   Preterm      AB  1   Living  2     SAB      TAB  1   Ectopic      Multiple      Live Births               Home Medications    Prior to Admission medications   Medication Sig Start Date End Date Taking? Authorizing Provider  albuterol (PROVENTIL HFA;VENTOLIN HFA) 108 (90 Base) MCG/ACT inhaler Inhale 1-2 puffs into the lungs every 6 (six) hours as needed for wheezing or shortness of breath. 01/26/16  Yes Tharon Aquas, PA  atenolol (TENORMIN) 25 MG tablet Take 1 tablet (25 mg total) by mouth daily. 07/26/16  Yes Charm Rings, MD  hydrochlorothiazide (HYDRODIURIL) 25 MG tablet Take 1 tablet (25 mg total) by mouth daily. 07/26/16  Yes Charm Rings, MD  ibuprofen  (ADVIL,MOTRIN) 200 MG tablet Take 200 mg by mouth every 6 (six) hours as needed for moderate pain.   Yes [provider]  Multiple Vitamins-Minerals (MULTIVITAMIN WITH MINERALS) tablet Take 1 tablet by mouth daily.   Yes [provider]  acetaminophen (TYLENOL) 500 MG tablet Take 2 tablets (1,000 mg total) by mouth every 6 (six) hours as needed. Patient not taking: Reported on 01/11/2019 01/18/17   Emi Holes, PA-C  cyclobenzaprine (FLEXERIL) 10 MG tablet Take 1 tablet (10 mg total) by mouth 3 (three) times daily as needed for muscle spasms. 01/11/19   Yardley Beltran, Cristal Deer, PA-C  famotidine (PEPCID) 20 MG tablet Take 1 tablet (20 mg total) by mouth 2 (two) times daily. X 5 days Patient not taking: Reported on 01/11/2019 04/10/15   Presson, Mathis Fare, PA  HYDROcodone-acetaminophen (NORCO/VICODIN) 5-325 MG tablet Take 1-2 tablets by mouth every 4 (four) hours as needed. Patient not taking: Reported on 01/11/2019 02/22/17   Dorena Bodo, NP  hydrOXYzine (ATARAX/VISTARIL) 25 MG tablet Take 1 tablet (25 mg total) by mouth every 8 (eight) hours as needed for itching. Patient not taking: Reported on 01/11/2019 04/10/15   Ria Clock, PA  ibuprofen (ADVIL,MOTRIN) 800 MG  tablet Take 1 tablet (800 mg total) by mouth 3 (three) times daily. Patient not taking: Reported on 01/11/2019 02/22/17   Dorena BodoKennard, Lawrence, NP  mupirocin ointment Idelle Jo(BACTROBAN) 2 % Apply to weeping areas on scalp BID x 7 days Patient not taking: Reported on 01/11/2019 04/10/15   Ria ClockPresson, Jennifer Lee H, PA  naproxen (NAPROSYN) 500 MG tablet Take 1 tablet (500 mg total) by mouth 2 (two) times daily. Patient not taking: Reported on 01/11/2019 10/12/17   Horton, Mayer Maskerourtney F, MD  predniSONE (DELTASONE) 50 MG tablet Take 1 tablet (50 mg total) by mouth daily with breakfast. 01/11/19   Dayanara Sherrill, Cristal Deerhristopher, PA-C  traMADol (ULTRAM) 50 MG tablet Take 1 tablet (50 mg total) by mouth every 6 (six) hours as needed for severe  pain. 01/11/19   Annarae Macnair, Cristal Deerhristopher, PA-C  triamcinolone cream (KENALOG) 0.1 % Apply 1 application topically 2 (two) times daily. Patient not taking: Reported on 01/11/2019 06/29/15   Hayden RasmussenMabe, David, NP    Family History Family History  Problem Relation Age of Onset  . Other Neg Hx     Social History Social History   Tobacco Use  . Smoking status: Current Every Day Smoker    Packs/day: 0.40    Years: 15.00    Pack years: 6.00  . Smokeless tobacco: Never Used  Substance Use Topics  . Alcohol use: Yes    Comment: mix drink social  . Drug use: No     Allergies   Patient has no known allergies.   Review of Systems Review of Systems  All other systems negative except as documented in the HPI. All pertinent positives and negatives as reviewed in the HPI. Physical Exam Updated Vital Signs BP 124/83   Pulse 69   Temp 98.4 F (36.9 C) (Oral)   Resp 16   LMP 12/21/2018 (Approximate)   SpO2 98%   Physical Exam Vitals signs and nursing note reviewed.  Constitutional:      General: She is not in acute distress.    Appearance: She is well-developed.  HENT:     Head: Normocephalic and atraumatic.  Eyes:     Pupils: Pupils are equal, round, and reactive to light.  Neck:     Musculoskeletal: Normal range of motion and neck supple.  Cardiovascular:     Rate and Rhythm: Normal rate and regular rhythm.     Heart sounds: Normal heart sounds. No murmur. No friction rub. No gallop.   Pulmonary:     Effort: Pulmonary effort is normal. No respiratory distress.     Breath sounds: Normal breath sounds. No wheezing.  Musculoskeletal:     Lumbar back: She exhibits tenderness. She exhibits normal range of motion, no bony tenderness and no swelling.       Back:  Skin:    General: Skin is warm and dry.     Capillary Refill: Capillary refill takes less than 2 seconds.     Findings: No erythema or rash.  Neurological:     Mental Status: She is alert and oriented to person, place, and  time.     Motor: No weakness or abnormal muscle tone.     Coordination: Coordination normal.     Gait: Gait normal.     Deep Tendon Reflexes: Reflexes normal.  Psychiatric:        Behavior: Behavior normal.      ED Treatments / Results  Labs (all labs ordered are listed, but only abnormal results are displayed) Labs Reviewed - No data  to display  EKG None  Radiology Vas [place] Lower Extremity Venous (dvt) (only [facility])  Result Date: 01/11/2019  Lower Venous Study Indications: Pain.  Performing Technologist: Gertie Fey MHA, RDMS, RVT, RDCS  Examination Guidelines: A complete evaluation includes B-mode imaging, spectral Doppler, color Doppler, and power Doppler as needed of all accessible portions of each vessel. Bilateral testing is considered an integral part of a complete examination. Limited examinations for reoccurring indications may be performed as noted.  Right Venous Findings: +---------+---------------+---------+-----------+----------+-------+          CompressibilityPhasicitySpontaneityPropertiesSummary +---------+---------------+---------+-----------+----------+-------+ CFV      Full           Yes      Yes                          +---------+---------------+---------+-----------+----------+-------+ SFJ      Full                                                 +---------+---------------+---------+-----------+----------+-------+ FV Prox  Full                                                 +---------+---------------+---------+-----------+----------+-------+ FV Mid   Full                                                 +---------+---------------+---------+-----------+----------+-------+ FV DistalFull                                                 +---------+---------------+---------+-----------+----------+-------+ PFV      Full                                                  +---------+---------------+---------+-----------+----------+-------+ POP      Full           Yes      Yes                          +---------+---------------+---------+-----------+----------+-------+ PTV      Full                                                 +---------+---------------+---------+-----------+----------+-------+ PERO     Full                                                 +---------+---------------+---------+-----------+----------+-------+  Left Venous Findings: +---+---------------+---------+-----------+----------+-------+    CompressibilityPhasicitySpontaneityPropertiesSummary +---+---------------+---------+-----------+----------+-------+ CFVFull  Yes      Yes                          +---+---------------+---------+-----------+----------+-------+    Summary: Right: There is no evidence of deep vein thrombosis in the lower extremity. No cystic structure found in the popliteal fossa. Left: No evidence of common femoral vein obstruction.  *See table(s) above for measurements and observations. Electronically signed by Fabienne Brunsharles Fields MD on 01/11/2019 at 5:27:42 PM.    Final     Procedures Procedures (including critical care time)  Medications Ordered in ED Medications  ketorolac (TORADOL) injection 60 mg (60 mg Intramuscular Given 01/11/19 1223)     Initial Impression / Assessment and Plan / ED Course  I have reviewed the triage vital signs and the nursing notes.  Pertinent labs & imaging results that were available during my care of the patient were reviewed by me and considered in my medical decision making (see chart for details).     Patient has no blood clots noted on DVT study.  Patient most likely has sciatica based on the HPI and physical exam findings.  Patient is advised to use ice and heat on the area that is sore.  Told to return here as needed.  Told to follow-up with her primary care doctor. Final Clinical Impressions(s) / ED  Diagnoses   Final diagnoses:  Sciatica of right side    ED Discharge Orders         Ordered    predniSONE (DELTASONE) 50 MG tablet  Daily with breakfast     01/11/19 1217    traMADol (ULTRAM) 50 MG tablet  Every 6 hours PRN     01/11/19 1217    cyclobenzaprine (FLEXERIL) 10 MG tablet  3 times daily PRN     01/11/19 1217           Charlestine NightLawyer, Francesa Eugenio, PA-C 01/12/19 1529    Saren Corkern, Cristal Deerhristopher, PA-C 01/12/19 1530    Margarita Grizzleay, Danielle, MD 01/13/19 1425

## 2019-06-04 ENCOUNTER — Encounter (HOSPITAL_COMMUNITY): Payer: Self-pay | Admitting: Emergency Medicine

## 2019-06-04 ENCOUNTER — Inpatient Hospital Stay (HOSPITAL_COMMUNITY)
Admission: EM | Admit: 2019-06-04 | Discharge: 2019-06-07 | DRG: 392 | Disposition: A | Discharge status: Home / Self Care | Payer: Managed Care, Other (non HMO) | Attending: Internal Medicine | Admitting: Internal Medicine

## 2019-06-04 DIAGNOSIS — Z7952 Long term (current) use of systemic steroids: Secondary | ICD-10-CM

## 2019-06-04 DIAGNOSIS — I10 Essential (primary) hypertension: Secondary | ICD-10-CM | POA: Diagnosis present

## 2019-06-04 DIAGNOSIS — E876 Hypokalemia: Secondary | ICD-10-CM | POA: Diagnosis present

## 2019-06-04 DIAGNOSIS — Z8249 Family history of ischemic heart disease and other diseases of the circulatory system: Secondary | ICD-10-CM

## 2019-06-04 DIAGNOSIS — K5732 Diverticulitis of large intestine without perforation or abscess without bleeding: Principal | ICD-10-CM | POA: Diagnosis present

## 2019-06-04 DIAGNOSIS — F17210 Nicotine dependence, cigarettes, uncomplicated: Secondary | ICD-10-CM | POA: Diagnosis present

## 2019-06-04 DIAGNOSIS — Z720 Tobacco use: Secondary | ICD-10-CM | POA: Diagnosis present

## 2019-06-04 DIAGNOSIS — N760 Acute vaginitis: Secondary | ICD-10-CM | POA: Diagnosis present

## 2019-06-04 DIAGNOSIS — Z20828 Contact with and (suspected) exposure to other viral communicable diseases: Secondary | ICD-10-CM | POA: Diagnosis present

## 2019-06-04 DIAGNOSIS — K219 Gastro-esophageal reflux disease without esophagitis: Secondary | ICD-10-CM | POA: Diagnosis present

## 2019-06-04 HISTORY — DX: Tobacco use: Z72.0

## 2019-06-04 HISTORY — DX: Gastro-esophageal reflux disease without esophagitis: K21.9

## 2019-06-04 LAB — LIPASE, BLOOD: Lipase: 25 U/L (ref 11–51)

## 2019-06-04 LAB — COMPREHENSIVE METABOLIC PANEL
ALT: 11 U/L (ref 0–44)
AST: 12 U/L — ABNORMAL LOW (ref 15–41)
Albumin: 3.8 g/dL (ref 3.5–5.0)
Alkaline Phosphatase: 65 U/L (ref 38–126)
Anion gap: 13 (ref 5–15)
BUN: 13 mg/dL (ref 6–20)
CO2: 24 mmol/L (ref 22–32)
Calcium: 9.3 mg/dL (ref 8.9–10.3)
Chloride: 101 mmol/L (ref 98–111)
Creatinine, Ser: 0.79 mg/dL (ref 0.44–1.00)
GFR calc Af Amer: >60 mL/min (ref >60)
GFR calc non Af Amer: >60 mL/min (ref >60)
Glucose, Bld: 133 mg/dL — ABNORMAL HIGH (ref 70–99)
Potassium: 2.6 mmol/L — CL (ref 3.5–5.1)
Sodium: 138 mmol/L (ref 135–145)
Total Bilirubin: 0.8 mg/dL (ref 0.3–1.2)
Total Protein: 7.8 g/dL (ref 6.5–8.1)

## 2019-06-04 LAB — CBC
HCT: 40.4 % (ref 36.0–46.0)
Hemoglobin: 13.6 g/dL (ref 12.0–15.0)
MCH: 32.5 pg (ref 26.0–34.0)
MCHC: 33.7 g/dL (ref 30.0–36.0)
MCV: 96.4 fL (ref 80.0–100.0)
Platelets: 194 10*3/uL (ref 150–400)
RBC: 4.19 MIL/uL (ref 3.87–5.11)
RDW: 14 % (ref 11.5–15.5)
WBC: 18.3 10*3/uL — ABNORMAL HIGH (ref 4.0–10.5)
nRBC: 0 % (ref 0.0–0.2)

## 2019-06-04 LAB — I-STAT BETA HCG BLOOD, ED (MC, WL, AP ONLY): I-stat hCG, quantitative: <5 m[IU]/mL (ref <5)

## 2019-06-04 MED ORDER — SODIUM CHLORIDE 0.9% FLUSH
3.0000 mL | Freq: Once | INTRAVENOUS | Status: AC
  Administered 2019-06-05: 3 mL via INTRAVENOUS

## 2019-06-04 NOTE — ED Triage Notes (Signed)
Pt in POV reporting lower abd pain/pelvic pain X1 day. Denies N/V/D. Pt appears to be in discomfort in triage.

## 2019-06-05 ENCOUNTER — Encounter (HOSPITAL_COMMUNITY): Payer: Self-pay | Admitting: Internal Medicine

## 2019-06-05 ENCOUNTER — Emergency Department (HOSPITAL_COMMUNITY): Payer: Managed Care, Other (non HMO)

## 2019-06-05 DIAGNOSIS — K219 Gastro-esophageal reflux disease without esophagitis: Secondary | ICD-10-CM | POA: Diagnosis present

## 2019-06-05 DIAGNOSIS — K5732 Diverticulitis of large intestine without perforation or abscess without bleeding: Secondary | ICD-10-CM | POA: Diagnosis present

## 2019-06-05 DIAGNOSIS — E876 Hypokalemia: Secondary | ICD-10-CM | POA: Insufficient documentation

## 2019-06-05 DIAGNOSIS — I10 Essential (primary) hypertension: Secondary | ICD-10-CM | POA: Diagnosis not present

## 2019-06-05 DIAGNOSIS — Z720 Tobacco use: Secondary | ICD-10-CM | POA: Diagnosis present

## 2019-06-05 LAB — BASIC METABOLIC PANEL
Anion gap: 10 (ref 5–15)
BUN: 10 mg/dL (ref 6–20)
CO2: 25 mmol/L (ref 22–32)
Calcium: 8.3 mg/dL — ABNORMAL LOW (ref 8.9–10.3)
Chloride: 102 mmol/L (ref 98–111)
Creatinine, Ser: 0.69 mg/dL (ref 0.44–1.00)
GFR calc Af Amer: >60 mL/min (ref >60)
GFR calc non Af Amer: >60 mL/min (ref >60)
Glucose, Bld: 106 mg/dL — ABNORMAL HIGH (ref 70–99)
Potassium: 3 mmol/L — ABNORMAL LOW (ref 3.5–5.1)
Sodium: 137 mmol/L (ref 135–145)

## 2019-06-05 LAB — URINALYSIS, ROUTINE W REFLEX MICROSCOPIC
Bacteria, UA: NONE SEEN
Bilirubin Urine: NEGATIVE
Glucose, UA: NEGATIVE mg/dL
Ketones, ur: 5 mg/dL — AB
Leukocytes,Ua: NEGATIVE
Nitrite: NEGATIVE
Protein, ur: 30 mg/dL — AB
Specific Gravity, Urine: 1.017 (ref 1.005–1.030)
pH: 6 (ref 5.0–8.0)

## 2019-06-05 LAB — MAGNESIUM: Magnesium: 1.8 mg/dL (ref 1.7–2.4)

## 2019-06-05 LAB — WET PREP, GENITAL
Sperm: NONE SEEN
Trich, Wet Prep: NONE SEEN
Yeast Wet Prep HPF POC: NONE SEEN

## 2019-06-05 LAB — CBC
HCT: 37.5 % (ref 36.0–46.0)
Hemoglobin: 12.4 g/dL (ref 12.0–15.0)
MCH: 32.3 pg (ref 26.0–34.0)
MCHC: 33.1 g/dL (ref 30.0–36.0)
MCV: 97.7 fL (ref 80.0–100.0)
Platelets: 161 10*3/uL (ref 150–400)
RBC: 3.84 MIL/uL — ABNORMAL LOW (ref 3.87–5.11)
RDW: 14.1 % (ref 11.5–15.5)
WBC: 15.2 10*3/uL — ABNORMAL HIGH (ref 4.0–10.5)
nRBC: 0 % (ref 0.0–0.2)

## 2019-06-05 LAB — PROCALCITONIN: Procalcitonin: <0.1 ng/mL

## 2019-06-05 LAB — PROTIME-INR
INR: 1.3 — ABNORMAL HIGH (ref 0.8–1.2)
Prothrombin Time: 15.7 seconds — ABNORMAL HIGH (ref 11.4–15.2)

## 2019-06-05 LAB — LACTIC ACID, PLASMA
Lactic Acid, Venous: 0.8 mmol/L (ref 0.5–1.9)
Lactic Acid, Venous: 0.7 mmol/L (ref 0.5–1.9)

## 2019-06-05 LAB — APTT: aPTT: 33 seconds (ref 24–36)

## 2019-06-05 MED ORDER — METRONIDAZOLE IN NACL 5-0.79 MG/ML-% IV SOLN
500.0000 mg | Freq: Once | INTRAVENOUS | Status: AC
  Administered 2019-06-05: 500 mg via INTRAVENOUS
  Filled 2019-06-05: qty 100

## 2019-06-05 MED ORDER — CIPROFLOXACIN IN D5W 400 MG/200ML IV SOLN
400.0000 mg | Freq: Once | INTRAVENOUS | Status: AC
  Administered 2019-06-05: 400 mg via INTRAVENOUS
  Filled 2019-06-05: qty 200

## 2019-06-05 MED ORDER — HYDROCODONE-ACETAMINOPHEN 5-325 MG PO TABS
1.0000 | ORAL_TABLET | ORAL | Status: DC | PRN
  Administered 2019-06-05 – 2019-06-06 (×6): 2 via ORAL
  Administered 2019-06-07: 1 via ORAL
  Filled 2019-06-05 (×5): qty 2
  Filled 2019-06-05: qty 1
  Filled 2019-06-05 (×2): qty 2

## 2019-06-05 MED ORDER — MORPHINE SULFATE (PF) 4 MG/ML IV SOLN
4.0000 mg | Freq: Once | INTRAVENOUS | Status: AC
  Administered 2019-06-05: 4 mg via INTRAVENOUS
  Filled 2019-06-05: qty 1

## 2019-06-05 MED ORDER — SODIUM CHLORIDE 0.9 % IV BOLUS
1000.0000 mL | Freq: Once | INTRAVENOUS | Status: AC
  Administered 2019-06-05: 1000 mL via INTRAVENOUS

## 2019-06-05 MED ORDER — MORPHINE SULFATE (PF) 4 MG/ML IV SOLN
4.0000 mg | Freq: Once | INTRAVENOUS | Status: AC
Start: 2019-06-05 — End: 2019-06-05
  Administered 2019-06-05: 4 mg via INTRAVENOUS
  Filled 2019-06-05: qty 1

## 2019-06-05 MED ORDER — CIPROFLOXACIN IN D5W 400 MG/200ML IV SOLN
400.0000 mg | Freq: Two times a day (BID) | INTRAVENOUS | Status: DC
  Administered 2019-06-05 – 2019-06-07 (×4): 400 mg via INTRAVENOUS
  Filled 2019-06-05 (×5): qty 200

## 2019-06-05 MED ORDER — METRONIDAZOLE IN NACL 5-0.79 MG/ML-% IV SOLN
500.0000 mg | Freq: Three times a day (TID) | INTRAVENOUS | Status: DC
  Administered 2019-06-05 – 2019-06-07 (×6): 500 mg via INTRAVENOUS
  Filled 2019-06-05 (×6): qty 100

## 2019-06-05 MED ORDER — ONDANSETRON HCL 4 MG/2ML IJ SOLN: 4.0000 mg | Freq: Four times a day (QID) | INTRAMUSCULAR | Status: DC | PRN

## 2019-06-05 MED ORDER — POTASSIUM CHLORIDE 10 MEQ/100ML IV SOLN
10.0000 meq | INTRAVENOUS | Status: AC
  Administered 2019-06-05 (×2): 10 meq via INTRAVENOUS
  Filled 2019-06-05 (×2): qty 100

## 2019-06-05 MED ORDER — FAMOTIDINE 20 MG PO TABS: 20.0000 mg | ORAL_TABLET | Freq: Two times a day (BID) | ORAL | Status: DC

## 2019-06-05 MED ORDER — MORPHINE SULFATE (PF) 2 MG/ML IV SOLN
2.0000 mg | INTRAVENOUS | Status: DC | PRN
  Administered 2019-06-05: 2 mg via INTRAVENOUS
  Filled 2019-06-05: qty 1

## 2019-06-05 MED ORDER — ATENOLOL 25 MG PO TABS
25.0000 mg | ORAL_TABLET | Freq: Every day | ORAL | Status: DC
  Administered 2019-06-05 – 2019-06-07 (×3): 25 mg via ORAL
  Filled 2019-06-05 (×3): qty 1

## 2019-06-05 MED ORDER — SODIUM CHLORIDE 0.9 % IV SOLN
INTRAVENOUS | Status: DC
  Administered 2019-06-05 – 2019-06-06 (×4): via INTRAVENOUS

## 2019-06-05 MED ORDER — MAGNESIUM SULFATE IN D5W 1-5 GM/100ML-% IV SOLN
1.0000 g | Freq: Once | INTRAVENOUS | Status: AC
  Administered 2019-06-05: 1 g via INTRAVENOUS
  Filled 2019-06-05: qty 100

## 2019-06-05 MED ORDER — NICOTINE 21 MG/24HR TD PT24
21.0000 mg | MEDICATED_PATCH | Freq: Every day | TRANSDERMAL | Status: DC
  Administered 2019-06-07: 21 mg via TRANSDERMAL
  Filled 2019-06-05 (×2): qty 1

## 2019-06-05 MED ORDER — POTASSIUM CHLORIDE CRYS ER 20 MEQ PO TBCR
40.0000 meq | EXTENDED_RELEASE_TABLET | Freq: Once | ORAL | Status: AC
  Administered 2019-06-05: 40 meq via ORAL
  Filled 2019-06-05: qty 2

## 2019-06-05 MED ORDER — ACETAMINOPHEN 325 MG PO TABS: 650.0000 mg | ORAL_TABLET | Freq: Four times a day (QID) | ORAL | Status: DC | PRN

## 2019-06-05 MED ORDER — SODIUM CHLORIDE 0.9 % IV BOLUS (SEPSIS)
1000.0000 mL | Freq: Once | INTRAVENOUS | Status: AC
  Administered 2019-06-05: 1000 mL via INTRAVENOUS

## 2019-06-05 MED ORDER — ONDANSETRON HCL 4 MG PO TABS: 4.0000 mg | ORAL_TABLET | Freq: Four times a day (QID) | ORAL | Status: DC | PRN

## 2019-06-05 MED ORDER — ONDANSETRON HCL 4 MG/2ML IJ SOLN
4.0000 mg | Freq: Once | INTRAMUSCULAR | Status: AC
  Administered 2019-06-05: 4 mg via INTRAVENOUS
  Filled 2019-06-05: qty 2

## 2019-06-05 MED ORDER — IOHEXOL 300 MG/ML  SOLN
100.0000 mL | Freq: Once | INTRAMUSCULAR | Status: AC | PRN
  Administered 2019-06-05: 100 mL via INTRAVENOUS

## 2019-06-05 MED ORDER — HYDRALAZINE HCL 20 MG/ML IJ SOLN: 5.0000 mg | INTRAMUSCULAR | Status: DC | PRN

## 2019-06-05 MED ORDER — HEPARIN SODIUM (PORCINE) 5000 UNIT/ML IJ SOLN
5000.0000 [IU] | Freq: Three times a day (TID) | INTRAMUSCULAR | Status: DC
  Administered 2019-06-05 – 2019-06-07 (×6): 5000 [IU] via SUBCUTANEOUS
  Filled 2019-06-05 (×6): qty 1

## 2019-06-05 NOTE — ED Notes (Signed)
Patient transported to CT 

## 2019-06-05 NOTE — ED Provider Notes (Signed)
TIME SEEN: 12:13 AM  CHIEF COMPLAINT: Abdominal pain  HPI: Patient is a 53 year old female with history of hypertension who presents to the emergency department with 1 day of lower abdominal pain.  States she initially thought that this was her menstrual cycle but then pain progressively got worse.  Has had one episode of nausea and vomiting.  No diarrhea.  No dysuria or vaginal discharge.  Last time she was sexually active was in January.  No history of STDs.  She has had 2 previous pregnancies and has 2 children.  Has never had pain like this before.  Denies known fever at home.  No cough, chest pain or shortness of breath.  No sick contacts.  ROS: See HPI Constitutional: no fever  Eyes: no drainage  ENT: no runny nose   Cardiovascular:  no chest pain  Resp: no SOB  GI:  vomiting GU: no dysuria Integumentary: no rash  Allergy: no hives  Musculoskeletal: no leg swelling  Neurological: no slurred speech ROS otherwise negative  PAST MEDICAL HISTORY/PAST SURGICAL HISTORY:  Past Medical History:  Diagnosis Date  . Hypertension     MEDICATIONS:  Prior to Admission medications   Medication Sig Start Date End Date Taking? Authorizing Provider  acetaminophen (TYLENOL) 500 MG tablet Take 2 tablets (1,000 mg total) by mouth every 6 (six) hours as needed. Patient not taking: Reported on 01/11/2019 01/18/17   Emi HolesLaw, Alexandra M, PA-C  albuterol (PROVENTIL HFA;VENTOLIN HFA) 108 (90 Base) MCG/ACT inhaler Inhale 1-2 puffs into the lungs every 6 (six) hours as needed for wheezing or shortness of breath. 01/26/16   Tharon AquasPatrick, Frank C, PA  atenolol (TENORMIN) 25 MG tablet Take 1 tablet (25 mg total) by mouth daily. 07/26/16   Charm RingsHonig, Erin J, MD  cyclobenzaprine (FLEXERIL) 10 MG tablet Take 1 tablet (10 mg total) by mouth 3 (three) times daily as needed for muscle spasms. 01/11/19   Lawyer, Cristal Deerhristopher, PA-C  famotidine (PEPCID) 20 MG tablet Take 1 tablet (20 mg total) by mouth 2 (two) times daily. X 5  days Patient not taking: Reported on 01/11/2019 04/10/15   Ria ClockPresson, Jennifer Lee H, PA  hydrochlorothiazide (HYDRODIURIL) 25 MG tablet Take 1 tablet (25 mg total) by mouth daily. 07/26/16   Charm RingsHonig, Erin J, MD  HYDROcodone-acetaminophen (NORCO/VICODIN) 5-325 MG tablet Take 1-2 tablets by mouth every 4 (four) hours as needed. Patient not taking: Reported on 01/11/2019 02/22/17   Dorena BodoKennard, Lawrence, NP  hydrOXYzine (ATARAX/VISTARIL) 25 MG tablet Take 1 tablet (25 mg total) by mouth every 8 (eight) hours as needed for itching. Patient not taking: Reported on 01/11/2019 04/10/15   Presson, Mathis FareJennifer Lee H, PA  ibuprofen (ADVIL,MOTRIN) 200 MG tablet Take 200 mg by mouth every 6 (six) hours as needed for moderate pain.    [provider]  ibuprofen (ADVIL,MOTRIN) 800 MG tablet Take 1 tablet (800 mg total) by mouth 3 (three) times daily. Patient not taking: Reported on 01/11/2019 02/22/17   Dorena BodoKennard, Lawrence, NP  Multiple Vitamins-Minerals (MULTIVITAMIN WITH MINERALS) tablet Take 1 tablet by mouth daily.    [provider]  mupirocin ointment (BACTROBAN) 2 % Apply to weeping areas on scalp BID x 7 days Patient not taking: Reported on 01/11/2019 04/10/15   Ria ClockPresson, Jennifer Lee H, PA  naproxen (NAPROSYN) 500 MG tablet Take 1 tablet (500 mg total) by mouth 2 (two) times daily. Patient not taking: Reported on 01/11/2019 10/12/17   Horton, Mayer Maskerourtney F, MD  predniSONE (DELTASONE) 50 MG tablet Take 1 tablet (50  mg total) by mouth daily with breakfast. 01/11/19   Lawyer, Cristal Deerhristopher, PA-C  traMADol (ULTRAM) 50 MG tablet Take 1 tablet (50 mg total) by mouth every 6 (six) hours as needed for severe pain. 01/11/19   Lawyer, Cristal Deerhristopher, PA-C  triamcinolone cream (KENALOG) 0.1 % Apply 1 application topically 2 (two) times daily. Patient not taking: Reported on 01/11/2019 06/29/15   Hayden RasmussenMabe, David, NP    ALLERGIES:  No Known Allergies  SOCIAL HISTORY:  Social History   Tobacco Use  . Smoking status: Current Every Day  Smoker    Packs/day: 0.50    Years: 15.00    Pack years: 7.50  . Smokeless tobacco: Never Used  Substance Use Topics  . Alcohol use: Yes    Comment: Socially     FAMILY HISTORY: Family History  Problem Relation Age of Onset  . Other Neg Hx     EXAM: BP 122/78 (BP Location: Right Arm)   Pulse 99   Temp 98.5 F (36.9 C) (Oral)   Resp 20   Ht 5\' 5"  (1.651 m)   Wt 99.8 kg   SpO2 100%   BMI 36.61 kg/m  CONSTITUTIONAL: Alert and oriented and responds appropriately to questions. Well-appearing; well-nourished HEAD: Normocephalic EYES: Conjunctivae clear, pupils appear equal, EOMI ENT: normal nose; moist mucous membranes NECK: Supple, no meningismus, no nuchal rigidity, no LAD  CARD: RRR; S1 and S2 appreciated; no murmurs, no clicks, no rubs, no gallops RESP: Normal chest excursion without splinting or tachypnea; breath sounds clear and equal bilaterally; no wheezes, no rhonchi, no rales, no hypoxia or respiratory distress, speaking full sentences ABD/GI: Normal bowel sounds; non-distended; soft, ender throughout the pelvic region and at McBurney's point with some voluntary guarding, no rebound GU:  Normal external genitalia. No lesions, rashes noted. Patient has no vaginal bleeding on exam.  No significant vaginal discharge.  Patient has bilateral adnexal tenderness without mass or fullness.  Mild cervical motion tenderness. Cervix is not appear friable.  Cervix is closed.  Chaperone present for exam. BACK:  The back appears normal and is non-tender to palpation, there is no CVA tenderness EXT: Normal ROM in all joints; non-tender to palpation; no edema; normal capillary refill; no cyanosis, no calf tenderness or swelling    SKIN: Normal color for age and race; warm; no rash NEURO: Moves all extremities equally PSYCH: The patient's mood and manner are appropriate. Grooming and personal hygiene are appropriate.  MEDICAL DECISION MAKING: Patient here with abdominal pain.   Differential includes appendicitis, UTI, TOA, PID, colitis, diverticulitis.  Labs obtained in triage show leukocytosis of 18,000.  Potassium low at 2.6.  Will obtain EKG and give potassium replacement.  Pregnancy test is negative.  Will obtain urinalysis.  She feels very warm to touch.  Will check rectal temperature.  Will perform pelvic exam to determine appropriate imaging.  ED PROGRESS: Patient is tender over both adnexa and has some cervical motion tenderness on exam.  Will obtain transvaginal ultrasound.  Her rectal temperature is normal.  She states she has not had any documented fevers at home.  2:25 AM  Transvaginal ultrasound shows no acute abnormality.  We will proceed with CT imaging to rule out appendicitis or other acute pathology.  Will give second dose of pain medicine in the ED.  4:10 AM  Pt's CT scan shows findings consistent with acute sigmoid diverticulitis.  There is a small amount of free fluid in the patient's abdomen without a drainable abscess or fluid collection and no  obvious perforation.  She does have a dilated appendix without signs of acute appendicitis.  She was tender to palpation however throughout her entire lower abdomen.  My suspicion that this is appendicitis is low but I feel she should be admitted to the hospital for further monitoring and serial abdominal exams.  Will give IV Cipro and Flagyl.  Currently her pain is well controlled after her second dose of morphine.  4:27 AM Discussed patient's case with hospitalist, Dr. Blaine Hamper.  I have recommended admission and patient (and family if present) agree with this plan. Admitting physician will place admission orders.   I reviewed all nursing notes, vitals, pertinent previous records, EKGs, lab and urine results, imaging (as available).     EKG Interpretation  Date/Time:  Saturday June 05 2019 00:46:26 EDT Ventricular Rate:  89 PR Interval:    QRS Duration: 78 QT Interval:  348 QTC Calculation: 424 R  Axis:   46 Text Interpretation:  Sinus rhythm Borderline T abnormalities, anterior leads no prolonged QT interval Confirmed by Pryor Curia [id number]) on 06/05/2019 1:11:06 AM          Debbie Ball, Delice Bison, DO 06/05/19 2919

## 2019-06-05 NOTE — Progress Notes (Signed)
Pharmacy Antibiotic Note  Debbie Ball is a 53 y.o. female admitted on 06/04/2019 with intra abdominal infection.  Pharmacy has been consulted for cipro dosing. Cipro 400 mg IV ordered in the ED Plan: Continue cipro 400 mg IV q12 hours F/u renal function, cultures and clinical course  Height: 5\' 5"  (165.1 cm) Weight: 220 lb (99.8 kg) IBW/kg (Calculated) : 57  Temp (24hrs), Avg:98.3 F (36.8 C), Min:98.1 F (36.7 C), Max:98.5 F (36.9 C)  Recent Labs  Lab 06/04/19 2238  WBC 18.3*  CREATININE 0.79    Estimated Creatinine Clearance: 97.3 mL/min (by C-G formula based on SCr of 0.79 mg/dL).    No Known Allergies  Thank you for allowing pharmacy to be a part of this patient's care.  Debbie Ball 06/05/2019 4:56 AM

## 2019-06-05 NOTE — Progress Notes (Signed)
Patient arrived to 6N10, A&Ox4, VSS, IV in LAC intact and infusing NS @ 125.  Patient rates pain 6/10.  Patient oriented to room and equipment.  Nurse confirmed primary contact information for this patient.  Will continue to monitor.

## 2019-06-05 NOTE — Progress Notes (Signed)
PROGRESS NOTE    Patient: Debbie Ball                            PCP: Quitman LivingsHassan, Sami, MD                    DOB: 1966/10/11            DOA: 06/04/2019 [id number]             DOS: 06/05/2019, 3:58 PM   LOS: 0 days   Date of Service: The patient was seen and examined on 06/05/2019  Subjective:   The patient was seen and examined this morning still complaining of excessive pain and discomfort in her abdomen.  Still complaining of nausea but no vomiting N.p.o.   Brief Narrative:   Debbie Ball is a 53 y.o. female with medical history significant of HTN, GERD and smoking, who presents with abdominal pain and pelvic pain.   Patient states that she has been having abdominal pain and pelvic pain in the past 2 days, which has worsened today.  Initially the abdominal pain started in the left lower quadrant, now involving whole lower abdomen and pelvis.  The pain is constant 10 out of 10 in severity, sharp, nonradiating.   Patient has nausea and vomited once, no diarrhea.    Denies fever or chills.  Patient does not have chest pain, shortness of breath, cough.  No symptoms of UTI or unilateral weakness.  Denies vaginal discharge or bleeding  ED Course: pt was found to have WBC 18.3, potassium 2.6, renal function normal, negative pregnancy test, lipase 25, negative urinalysis, wet prep showed clue cells, temperature normal, heart rate is 99, 87, RR 25, 17, oxygen saturation 96 to 99% on room air.  Pelvic ultrasound is negative.  CT abdomen/pelvis showed sigmoid diverticulitis.  CT-abd/pelvis showed 1. acute sigmoid diverticulitis. There is a small amount of adjacent free fluid in the patient's without evidence of a well-formed drainable fluid collection or abscess.  2. The appendix is located in the right lower quadrant and is moderately dilated measuring up to approximately 1 cm in diameter.   Assessment & Plan:    Principal Problem:   Sigmoid diverticulitis Active Problems:  Hypertension   GERD (gastroesophageal reflux disease)   Tobacco abuse    Sigmoid diverticulitis and possible sepsis:  -Ruling out sepsis: On admission WBC 18.3, tachycardia, tachypnea, L.A 0.8, 0.7 -Continue to complain of abdominal pain -Some nausea but no vomiting -Currently n.p.o. -CT scan showed acute sigmoid diverticulitis without abscess formation.   -Leukocytosis 18.3 >> 15.2 - CT scan also showed that the appendix is located in the right lower quadrant and is moderately dilated measuring up to approximately 1 cm in diameter. Her lower abdominal pain is diffused, not localized to McBurney's point. Will treat pt as acute diverticulitis now, if no improvement--> may need to consult surgeon.    -IV Cipro and Flagyl started in ED -prn morphine, Norco for pain -prn Zofran for nausea. -IVF: 2L of NS bolus in ED, followed by 125 cc/  Bacterial vaginosis -Wet prep showed clue cells, but no vaginal bleeding or discharge.  - Patient is on Flagyl which should cover BV  Hypertension: -Currently stable, -continue labetalol, holding HCTZ -PRN hydralazine  GERD (gastroesophageal reflux disease): -Continue IV PPI  Tobacco abuse: -nicotine patch -Patient was advised on smoking cessation  Hypokalemia -Repleting accordingly, checking magnesium level -k+ = 2.6 >>  DVT prophylaxis: SCD/Compression stockings and Heparin SQ Code Status:   Code Status: Full Code Family Communication: No family member present. Disposition Plan:  >3 days Consultants: None  Procedures:       Antimicrobials:  Anti-infectives (From admission, onward)   Start     Dose/Rate Route Frequency Ordered Stop   06/05/19 1600  ciprofloxacin (CIPRO) IVPB 400 mg     400 mg 200 mL/hr over 60 Minutes Intravenous Every 12 hours 06/05/19 0459     06/05/19 1400  metroNIDAZOLE (FLAGYL) IVPB 500 mg     500 mg 100 mL/hr over 60 Minutes Intravenous Every 8 hours 06/05/19 0800     06/05/19 0415   ciprofloxacin (CIPRO) IVPB 400 mg     400 mg 200 mL/hr over 60 Minutes Intravenous  Once 06/05/19 0411 06/05/19 0614   06/05/19 0415  metroNIDAZOLE (FLAGYL) IVPB 500 mg     500 mg 100 mL/hr over 60 Minutes Intravenous  Once 06/05/19 0411 06/05/19 0715       Medication:  . atenolol  25 mg Oral Daily  . heparin  5,000 Units Subcutaneous Q8H  . nicotine  21 mg Transdermal Daily    acetaminophen, hydrALAZINE, HYDROcodone-acetaminophen, morphine injection, ondansetron **OR** ondansetron (ZOFRAN) IV     Objective:   Vitals:   06/05/19 0500 06/05/19 0515 06/05/19 0809 06/05/19 1515  BP: 128/82 131/81 119/77 102/60  Pulse: 91 87 87 67  Resp:  17 18 20   Temp:   99.9 F (37.7 C) 99.1 F (37.3 C)  TempSrc:   Oral Oral  SpO2: 99% 100% 100% 99%  Weight:      Height:        Intake/Output Summary (Last 24 hours) at 06/05/2019 1558 Last data filed at 06/05/2019 1327 Gross per 24 hour  Intake 1094.69 ml  Output -  Net 1094.69 ml   Filed Weights   06/04/19 2230  Weight: 99.8 kg     Examination:   Physical Exam  Constitution:  Alert, cooperative, no distress,  Appears calm and comfortable  Psychiatric: Normal and stable mood and affect, cognition intact,   HEENT: Normocephalic, PERRL, otherwise with in Normal limits  Chest:Chest symmetric Cardio vascular:  S1/S2, RRR, No murmure, No Rubs or Gallops  pulmonary: Clear to auscultation bilaterally, respirations unlabored, negative wheezes / crackles Abdomen: Soft, diffuse tenderness, specifically bilateral lower quadrant, negative for any rebound tenderness non-distended, bowel sounds,no masses, no organomegaly, negative Murphy sign Muscular skeletal: Limited exam - in bed, able to move all 4 extremities, Normal strength,  Neuro: CNII-XII intact. , normal motor and sensation, reflexes intact  Extremities: No pitting edema lower extremities, +2 pulses  Skin: Dry, warm to touch, negative for any Rashes, No open wounds   LABs:   CBC Latest Ref Rng & Units 06/05/2019 06/04/2019 01/16/2015  WBC 4.0 - 10.5 K/uL 15.2(H) 18.3(H) 6.9  Hemoglobin 12.0 - 15.0 g/dL 46.912.4 62.913.6 52.813.3  Hematocrit 36.0 - 46.0 % 37.5 40.4 38.8  Platelets 150 - 400 K/uL 161 194 194   CMP Latest Ref Rng & Units 06/05/2019 06/04/2019 01/16/2015  Glucose 70 - 99 mg/dL 413(K106(H) 440(N133(H) 93  BUN 6 - 20 mg/dL 10 13 19   Creatinine 0.44 - 1.00 mg/dL 0.270.69 2.530.79 6.640.68  Sodium 135 - 145 mmol/L 137 138 142  Potassium 3.5 - 5.1 mmol/L 3.0(L) 2.6(LL) 4.1  Chloride 98 - 111 mmol/L 102 101 109  CO2 22 - 32 mmol/L 25 24 27   Calcium 8.9 - 10.3 mg/dL 8.3(L) 9.3 8.9  Total Protein 6.5 - 8.1 g/dL - 7.8 6.7  Total Bilirubin 0.3 - 1.2 mg/dL - 0.8 0.4  Alkaline Phos 38 - 126 U/L - 65 61  AST 15 - 41 U/L - 12(L) 17  ALT 0 - 44 U/L - 11 10        SIGNED: Deatra James, MD, FACP, FHM. [facility],  Pager [phone]  If 7PM-7AM, please contact night-coverage [email or web address] Ota [facility] 06/05/2019, 3:58 PM

## 2019-06-05 NOTE — ED Notes (Signed)
ED Provider at bedside. 

## 2019-06-05 NOTE — H&P (Signed)
History and Physical    Debbie RastKim Y Ball ZOX:[id number] DOB: 04-Oct-1966 DOA: 06/04/2019  Referring MD/NP/PA:   PCP: Quitman LivingsHassan, Sami, MD   Patient coming from:  The patient is coming from home.  At baseline, pt is independent for most of ADL.        Chief Complaint: Abdominal pain and pelvic pain  HPI: Debbie Ball is a 53 y.o. female with medical history significant of HTN, GERD and smoking, who presents with abdominal pain and pelvic pain.   Patient states that she has been having abdominal pain and pelvic pain in the past 2 days, which has worsened today.  Initially the abdominal pain started in the left lower quadrant, now involving whole lower abdomen and pelvis.  The pain is constant 10 out of 10 in severity, sharp, nonradiating.  Patient has nausea vomited once, no diarrhea.  Denies fever or chills.  Patient does not have chest pain, shortness of breath, cough.  No symptoms of UTI or unilateral weakness.  Denies vaginal discharge or bleeding  ED Course: pt was found to have WBC 18.3, potassium 2.6, renal function normal, negative pregnancy test, lipase 25, negative urinalysis, wet prep showed clue cells, temperature normal, heart rate is 99, 87, RR 25, 17, oxygen saturation 96 to 99% on room air.  Pelvic ultrasound is negative.  CT abdomen/pelvis showed sigmoid diverticulitis.  CT-abd/pelvis showed 1. acute sigmoid diverticulitis. There is a small amount of adjacent free fluid in the patient's without evidence of a well-formed drainable fluid collection or abscess.  2. The appendix is located in the right lower quadrant and is moderately dilated measuring up to approximately 1 cm in diameter.  Review of Systems:   General: no fevers, chills, no body weight gain, has poor appetite, has fatigue HEENT: no blurry vision, hearing changes or sore throat Respiratory: no dyspnea, coughing, wheezing CV: no chest pain, no palpitations GI: has nausea, vomiting, abdominal pain, and pelvic pain No  diarrhea, constipation GU: no dysuria, burning on urination, increased urinary frequency, hematuria  Ext: no leg edema Neuro: no unilateral weakness, numbness, or tingling, no vision change or hearing loss Skin: no rash, no skin tear. MSK: No muscle spasm, no deformity, no limitation of range of movement in spin Heme: No easy bruising.  Travel history: No recent long distant travel.  Allergy: No Known Allergies  Past Medical History:  Diagnosis Date   GERD (gastroesophageal reflux disease)    Hypertension    Tobacco abuse     Past Surgical History:  Procedure Laterality Date   INDUCED ABORTION      Social History:  reports that she has been smoking. She has a 7.50 pack-year smoking history. She has never used smokeless tobacco. She reports current alcohol use. She reports that she does not use drugs.  Family History:  Family History  Problem Relation Age of Onset   Hypertension Father    Hypertension Sister    Other Neg Hx      Prior to Admission medications   Medication Sig Start Date End Date Taking? Authorizing Provider  acetaminophen (TYLENOL) 500 MG tablet Take 2 tablets (1,000 mg total) by mouth every 6 (six) hours as needed. Patient not taking: Reported on 01/11/2019 01/18/17   Emi HolesLaw, Alexandra M, PA-C  albuterol (PROVENTIL HFA;VENTOLIN HFA) 108 (90 Base) MCG/ACT inhaler Inhale 1-2 puffs into the lungs every 6 (six) hours as needed for wheezing or shortness of breath. 01/26/16   Tharon AquasPatrick, Frank C, PA  atenolol (TENORMIN) 25 MG  tablet Take 1 tablet (25 mg total) by mouth daily. 07/26/16   Melony Overly, MD  cyclobenzaprine (FLEXERIL) 10 MG tablet Take 1 tablet (10 mg total) by mouth 3 (three) times daily as needed for muscle spasms. 01/11/19   Lawyer, Harrell Gave, PA-C  famotidine (PEPCID) 20 MG tablet Take 1 tablet (20 mg total) by mouth 2 (two) times daily. X 5 days Patient not taking: Reported on 01/11/2019 04/10/15   Lutricia Feil, PA  hydrochlorothiazide  (HYDRODIURIL) 25 MG tablet Take 1 tablet (25 mg total) by mouth daily. 07/26/16   Melony Overly, MD  HYDROcodone-acetaminophen (NORCO/VICODIN) 5-325 MG tablet Take 1-2 tablets by mouth every 4 (four) hours as needed. Patient not taking: Reported on 01/11/2019 02/22/17   Barnet Glasgow, NP  hydrOXYzine (ATARAX/VISTARIL) 25 MG tablet Take 1 tablet (25 mg total) by mouth every 8 (eight) hours as needed for itching. Patient not taking: Reported on 01/11/2019 04/10/15   Presson, Audelia Hives, PA  ibuprofen (ADVIL,MOTRIN) 200 MG tablet Take 200 mg by mouth every 6 (six) hours as needed for moderate pain.    [provider]  ibuprofen (ADVIL,MOTRIN) 800 MG tablet Take 1 tablet (800 mg total) by mouth 3 (three) times daily. Patient not taking: Reported on 01/11/2019 02/22/17   Barnet Glasgow, NP  Multiple Vitamins-Minerals (MULTIVITAMIN WITH MINERALS) tablet Take 1 tablet by mouth daily.    [provider]  mupirocin ointment (BACTROBAN) 2 % Apply to weeping areas on scalp BID x 7 days Patient not taking: Reported on 01/11/2019 04/10/15   Lutricia Feil, PA  naproxen (NAPROSYN) 500 MG tablet Take 1 tablet (500 mg total) by mouth 2 (two) times daily. Patient not taking: Reported on 01/11/2019 10/12/17   Horton, Barbette Hair, MD  predniSONE (DELTASONE) 50 MG tablet Take 1 tablet (50 mg total) by mouth daily with breakfast. 01/11/19   Lawyer, Harrell Gave, PA-C  traMADol (ULTRAM) 50 MG tablet Take 1 tablet (50 mg total) by mouth every 6 (six) hours as needed for severe pain. 01/11/19   Lawyer, Harrell Gave, PA-C  triamcinolone cream (KENALOG) 0.1 % Apply 1 application topically 2 (two) times daily. Patient not taking: Reported on 01/11/2019 06/29/15   Janne Napoleon, NP    Physical Exam: Vitals:   06/05/19 0300 06/05/19 0330 06/05/19 0400 06/05/19 0430  BP: 133/77   131/62  Pulse: 91 97 87 94  Resp:  19 17 20   Temp:      TempSrc:      SpO2: 99% 99% 99% 99%  Weight:      Height:        General: Not in acute distress HEENT:       Eyes: PERRL, EOMI, no scleral icterus.       ENT: No discharge from the ears and nose, no pharynx injection, no tonsillar enlargement.        Neck: No JVD, no bruit, no mass felt. Heme: No neck lymph node enlargement. Cardiac: S1/S2, RRR, No murmurs, No gallops or rubs. Respiratory: No rales, wheezing, rhonchi or rubs. GI: Soft, nondistended, has tenderness in lower abdomen, no rebound pain, no organomegaly, BS present. GU: No hematuria Ext: No pitting leg edema bilaterally. 2+DP/PT pulse bilaterally. Musculoskeletal: No joint deformities, No joint redness or warmth, no limitation of ROM in spin. Skin: No rashes.  Neuro: Alert, oriented X3, cranial nerves II-XII grossly intact, moves all extremities normally.  Psych: Patient is not psychotic, no suicidal or hemocidal ideation.  Labs on Admission: I  have personally reviewed following labs and imaging studies  CBC: Recent Labs  Lab 06/04/19 2238  WBC 18.3*  HGB 13.6  HCT 40.4  MCV 96.4  PLT 194   Basic Metabolic Panel: Recent Labs  Lab 06/04/19 2238  NA 138  K 2.6*  CL 101  CO2 24  GLUCOSE 133*  BUN 13  CREATININE 0.79  CALCIUM 9.3   GFR: Estimated Creatinine Clearance: 97.3 mL/min (by C-G formula based on SCr of 0.79 mg/dL). Liver Function Tests: Recent Labs  Lab 06/04/19 2238  AST 12*  ALT 11  ALKPHOS 65  BILITOT 0.8  PROT 7.8  ALBUMIN 3.8   Recent Labs  Lab 06/04/19 2238  LIPASE 25   No results for input(s): AMMONIA in the last 168 hours. Coagulation Profile: No results for input(s): INR, PROTIME in the last 168 hours. Cardiac Enzymes: No results for input(s): CKTOTAL, CKMB, CKMBINDEX, TROPONINI in the last 168 hours. BNP (last 3 results) No results for input(s): PROBNP in the last 8760 hours. HbA1C: No results for input(s): HGBA1C in the last 72 hours. CBG: No results for input(s): GLUCAP in the last 168 hours. Lipid Profile: No results for  input(s): CHOL, HDL, LDLCALC, TRIG, CHOLHDL, LDLDIRECT in the last 72 hours. Thyroid Function Tests: No results for input(s): TSH, T4TOTAL, FREET4, T3FREE, THYROIDAB in the last 72 hours. Anemia Panel: No results for input(s): VITAMINB12, FOLATE, FERRITIN, TIBC, IRON, RETICCTPCT in the last 72 hours. Urine analysis:    Component Value Date/Time   COLORURINE YELLOW 06/05/2019 0041   APPEARANCEUR CLEAR 06/05/2019 0041   LABSPEC 1.017 06/05/2019 0041   PHURINE 6.0 06/05/2019 0041   GLUCOSEU NEGATIVE 06/05/2019 0041   HGBUR SMALL (A) 06/05/2019 0041   BILIRUBINUR NEGATIVE 06/05/2019 0041   KETONESUR 5 (A) 06/05/2019 0041   PROTEINUR 30 (A) 06/05/2019 0041   NITRITE NEGATIVE 06/05/2019 0041   LEUKOCYTESUR NEGATIVE 06/05/2019 0041   Sepsis Labs: @LABRCNTIP (procalcitonin:4,lacticidven:4) ) Recent Results (from the past 240 hour(s))  Wet prep, genital     Status: Abnormal   Collection Time: 06/05/19  1:08 AM   Specimen: Cervix; Genital  Result Value Ref Range Status   Yeast Wet Prep HPF POC NONE SEEN NONE SEEN Final   Trich, Wet Prep NONE SEEN NONE SEEN Final   Clue Cells Wet Prep HPF POC PRESENT (A) NONE SEEN Final   WBC, Wet Prep HPF POC MANY (A) NONE SEEN Final   Sperm NONE SEEN  Final    Comment: Performed at [facility], [address] [place], [place] [address]     Radiological Exams on Admission: [place] Transvaginal Non-ob  Result Date: 06/05/2019 CLINICAL DATA:  Initial evaluation for acute pelvic pain. EXAM: TRANSABDOMINAL AND TRANSVAGINAL ULTRASOUND OF PELVIS DOPPLER ULTRASOUND OF OVARIES TECHNIQUE: Both transabdominal and transvaginal ultrasound examinations of the pelvis were performed. Transabdominal technique was performed for global imaging of the pelvis including uterus, ovaries, adnexal regions, and pelvic cul-de-sac. It was necessary to proceed with endovaginal exam following the transabdominal exam to visualize the uterus, endometrium, and ovaries. Color and  duplex Doppler ultrasound was utilized to evaluate blood flow to the ovaries. COMPARISON:  None. FINDINGS: Uterus Measurements: 9.3 x 3.6 x 4.4 cm = volume: 75.7 mL. No fibroids or other mass visualized. Endometrium Thickness: 4.7 mm.  No focal abnormality visualized. Right ovary Measurements: 2.3 x 1.6 x 2.0 cm = volume: 3.7 mL. Normal appearance/no adnexal mass. Left ovary Measurements: 3.1 x 1.0 x 2.0 cm = volume: 3.3 mL. Normal  appearance/no adnexal mass. Pulsed Doppler evaluation of both ovaries demonstrates small volume free physiologic fluid present within the pelvis. Other findings No abnormal free fluid. IMPRESSION: Normal pelvic ultrasound. No evidence for ovarian torsion or other acute finding. Electronically Signed   By: Rise MuBenjamin  McClintock M.D.   On: 06/05/2019 02:07   [place] Pelvis Complete  Result Date: 06/05/2019 CLINICAL DATA:  Initial evaluation for acute pelvic pain. EXAM: TRANSABDOMINAL AND TRANSVAGINAL ULTRASOUND OF PELVIS DOPPLER ULTRASOUND OF OVARIES TECHNIQUE: Both transabdominal and transvaginal ultrasound examinations of the pelvis were performed. Transabdominal technique was performed for global imaging of the pelvis including uterus, ovaries, adnexal regions, and pelvic cul-de-sac. It was necessary to proceed with endovaginal exam following the transabdominal exam to visualize the uterus, endometrium, and ovaries. Color and duplex Doppler ultrasound was utilized to evaluate blood flow to the ovaries. COMPARISON:  None. FINDINGS: Uterus Measurements: 9.3 x 3.6 x 4.4 cm = volume: 75.7 mL. No fibroids or other mass visualized. Endometrium Thickness: 4.7 mm.  No focal abnormality visualized. Right ovary Measurements: 2.3 x 1.6 x 2.0 cm = volume: 3.7 mL. Normal appearance/no adnexal mass. Left ovary Measurements: 3.1 x 1.0 x 2.0 cm = volume: 3.3 mL. Normal appearance/no adnexal mass. Pulsed Doppler evaluation of both ovaries demonstrates small volume free physiologic fluid present within the  pelvis. Other findings No abnormal free fluid. IMPRESSION: Normal pelvic ultrasound. No evidence for ovarian torsion or other acute finding. Electronically Signed   By: Rise MuBenjamin  McClintock M.D.   On: 06/05/2019 02:07   Ct Abdomen Pelvis W Contrast  Result Date: 06/05/2019 CLINICAL DATA:  Pelvic pain x1 day EXAM: CT ABDOMEN AND PELVIS WITH CONTRAST TECHNIQUE: Multidetector CT imaging of the abdomen and pelvis was performed using the standard protocol following bolus administration of intravenous contrast. CONTRAST:  100mL OMNIPAQUE IOHEXOL 300 MG/ML  SOLN COMPARISON:  Ultrasound from same day FINDINGS: Lower chest: No acute abnormality. Hepatobiliary: No focal liver abnormality is seen. No gallstones, gallbladder wall thickening, or biliary dilatation. Pancreas: Unremarkable. No pancreatic ductal dilatation or surrounding inflammatory changes. Spleen: Normal in size without focal abnormality. Adrenals/Urinary Tract: Adrenal glands are unremarkable. Kidneys are normal, without renal calculi, focal lesion, or hydronephrosis. Bladder is unremarkable. Stomach/Bowel: Overall findings are consistent with acute sigmoid diverticulitis. There are a few pockets of free fluid in the pelvis without evidence of a drainable fluid collection or abscess. The appendix is located in the right lower quadrant and is moderately dilated measuring up to approximately 1 cm in diameter. The stomach is unremarkable. There is no evidence of a small-bowel obstruction. Vascular/Lymphatic: No significant vascular findings are present. No enlarged abdominal or pelvic lymph nodes. Reproductive: Uterus and bilateral adnexa are unremarkable. Other: No abdominal wall hernia or abnormality. No abdominopelvic ascites. Musculoskeletal: No acute or significant osseous findings. IMPRESSION: 1. Findings consistent with acute sigmoid diverticulitis. There is a small amount of adjacent free fluid in the patient's without evidence of a well-formed  drainable fluid collection or abscess. 2. Dilated appendix measuring 1 cm in the right lower quadrant without definite CT evidence of acute cholecystitis. Correlation with physical exam is recommended. Electronically Signed   By: Katherine Mantlehristopher  Green M.D.   On: 06/05/2019 03:30   [place] Art/ven Flow Abd Pelv Doppler  Result Date: 06/05/2019 CLINICAL DATA:  Initial evaluation for acute pelvic pain. EXAM: TRANSABDOMINAL AND TRANSVAGINAL ULTRASOUND OF PELVIS DOPPLER ULTRASOUND OF OVARIES TECHNIQUE: Both transabdominal and transvaginal ultrasound examinations of the pelvis were performed. Transabdominal technique was performed for global imaging of the pelvis  including uterus, ovaries, adnexal regions, and pelvic cul-de-sac. It was necessary to proceed with endovaginal exam following the transabdominal exam to visualize the uterus, endometrium, and ovaries. Color and duplex Doppler ultrasound was utilized to evaluate blood flow to the ovaries. COMPARISON:  None. FINDINGS: Uterus Measurements: 9.3 x 3.6 x 4.4 cm = volume: 75.7 mL. No fibroids or other mass visualized. Endometrium Thickness: 4.7 mm.  No focal abnormality visualized. Right ovary Measurements: 2.3 x 1.6 x 2.0 cm = volume: 3.7 mL. Normal appearance/no adnexal mass. Left ovary Measurements: 3.1 x 1.0 x 2.0 cm = volume: 3.3 mL. Normal appearance/no adnexal mass. Pulsed Doppler evaluation of both ovaries demonstrates small volume free physiologic fluid present within the pelvis. Other findings No abnormal free fluid. IMPRESSION: Normal pelvic ultrasound. No evidence for ovarian torsion or other acute finding. Electronically Signed   By: Rise Mu M.D.   On: 06/05/2019 02:07     EKG: Independently reviewed.    Assessment/Plan Principal Problem:   Sigmoid diverticulitis Active Problems:   Hypertension   GERD (gastroesophageal reflux disease)   Tobacco abuse   Sigmoid diverticulitis and possible sepsis: CT scan showed acute sigmoid  diverticulitis without abscess formation.  Patient has a leukocytosis with WBC 18.3, initially has tachycardia and tachypnea, level meets criteria for sepsis.  Her tachycardia and tachypnea have resolved currently.  Hemodynamically stable.  Pending lactic acid level. CT scan also showed that the appendix is located in the right lower quadrant and is moderately dilated measuring up to approximately 1 cm in diameter. Her lower abdominal pain is diffused, not localized to McBurney's point. Will treat pt as acute diverticulitis now, if no improvement--> may need to consult surgeon. Wet prep showed clue cells, but no vaginal bleeding or discharge.  Patient is on Flagyl which should cover BV.  -will place on tele bed for obs -IV Cipro and Flagyl started in ED -prn morphine, Norco for pain -prn Zofran for nausea -will get Procalcitonin and trend lactic acid levels per sepsis protocol. -IVF: 2L of NS bolus in ED, followed by 125 cc/h   Hypertension: -continue atenolol -Hold HCTZ - IV hydralazine as needed  GERD (gastroesophageal reflux disease): -pepcid  Tobacco abuse: -nicotine patch   DVT ppx: SQ Heparin Code Status: Full code Family Communication: None at bed side.   Disposition Plan:  Anticipate discharge back to previous home environment Consults called:  none Admission status:    medical floor/obs      Date of Service 06/05/2019    Lorretta Harp [facility]   If 7PM-7AM, please contact night-coverage [email or web address] Password [facility] 06/05/2019, 4:53 AM

## 2019-06-06 DIAGNOSIS — Z20828 Contact with and (suspected) exposure to other viral communicable diseases: Secondary | ICD-10-CM | POA: Diagnosis present

## 2019-06-06 DIAGNOSIS — K219 Gastro-esophageal reflux disease without esophagitis: Secondary | ICD-10-CM | POA: Diagnosis not present

## 2019-06-06 DIAGNOSIS — Z7952 Long term (current) use of systemic steroids: Secondary | ICD-10-CM | POA: Diagnosis not present

## 2019-06-06 DIAGNOSIS — N760 Acute vaginitis: Secondary | ICD-10-CM | POA: Diagnosis present

## 2019-06-06 DIAGNOSIS — E876 Hypokalemia: Secondary | ICD-10-CM | POA: Diagnosis not present

## 2019-06-06 DIAGNOSIS — Z8249 Family history of ischemic heart disease and other diseases of the circulatory system: Secondary | ICD-10-CM | POA: Diagnosis not present

## 2019-06-06 DIAGNOSIS — I10 Essential (primary) hypertension: Secondary | ICD-10-CM | POA: Diagnosis not present

## 2019-06-06 DIAGNOSIS — F17210 Nicotine dependence, cigarettes, uncomplicated: Secondary | ICD-10-CM | POA: Diagnosis present

## 2019-06-06 DIAGNOSIS — K5732 Diverticulitis of large intestine without perforation or abscess without bleeding: Secondary | ICD-10-CM | POA: Diagnosis not present

## 2019-06-06 LAB — CBC WITH DIFFERENTIAL/PLATELET
Abs Immature Granulocytes: 0.07 10*3/uL (ref 0.00–0.07)
Basophils Absolute: 0.1 10*3/uL (ref 0.0–0.1)
Basophils Relative: 0 %
Eosinophils Absolute: 0.2 10*3/uL (ref 0.0–0.5)
Eosinophils Relative: 2 %
HCT: 34.1 % — ABNORMAL LOW (ref 36.0–46.0)
Hemoglobin: 11.1 g/dL — ABNORMAL LOW (ref 12.0–15.0)
Immature Granulocytes: 1 %
Lymphocytes Relative: 13 %
Lymphs Abs: 1.7 10*3/uL (ref 0.7–4.0)
MCH: 32.1 pg (ref 26.0–34.0)
MCHC: 32.6 g/dL (ref 30.0–36.0)
MCV: 98.6 fL (ref 80.0–100.0)
Monocytes Absolute: 0.8 10*3/uL (ref 0.1–1.0)
Monocytes Relative: 6 %
Neutro Abs: 9.6 10*3/uL — ABNORMAL HIGH (ref 1.7–7.7)
Neutrophils Relative %: 78 %
Platelets: 153 10*3/uL (ref 150–400)
RBC: 3.46 MIL/uL — ABNORMAL LOW (ref 3.87–5.11)
RDW: 14 % (ref 11.5–15.5)
WBC: 12.3 10*3/uL — ABNORMAL HIGH (ref 4.0–10.5)
nRBC: 0 % (ref 0.0–0.2)

## 2019-06-06 LAB — BASIC METABOLIC PANEL
Anion gap: 5 (ref 5–15)
BUN: 11 mg/dL (ref 6–20)
CO2: 24 mmol/L (ref 22–32)
Calcium: 7.8 mg/dL — ABNORMAL LOW (ref 8.9–10.3)
Chloride: 108 mmol/L (ref 98–111)
Creatinine, Ser: 0.72 mg/dL (ref 0.44–1.00)
GFR calc Af Amer: >60 mL/min (ref >60)
GFR calc non Af Amer: >60 mL/min (ref >60)
Glucose, Bld: 88 mg/dL (ref 70–99)
Potassium: 3.6 mmol/L (ref 3.5–5.1)
Sodium: 137 mmol/L (ref 135–145)

## 2019-06-06 LAB — HIV ANTIBODY (ROUTINE TESTING W REFLEX): HIV Screen 4th Generation wRfx: NONREACTIVE

## 2019-06-06 LAB — GLUCOSE, CAPILLARY: Glucose-Capillary: 84 mg/dL (ref 70–99)

## 2019-06-06 NOTE — Plan of Care (Signed)
  Problem: Pain Managment: Goal: General experience of comfort will improve Outcome: Progressing   

## 2019-06-06 NOTE — Progress Notes (Signed)
PROGRESS NOTE    Patient: Debbie Ball                            PCP: Quitman LivingsHassan, Sami, MD                    DOB: Apr 23, 1966            DOA: 06/04/2019 [id number]             DOS: 06/06/2019, 10:47 AM   LOS: 0 days   Date of Service: The patient was seen and examined on 06/06/2019  Subjective:   The patient was seen and examined this morning, still complaining of lower abdominal pain, improved nausea no vomiting, has not had any bowel movement.  Denies of having any bloody discharge rectally. Has been n.p.o. cooperative. Afebrile, mildly hypertensive this a.m.   Brief Narrative:   Debbie RastKim Y Mazzuca is a 53 y.o. female with medical history significant of HTN, GERD and smoking, who presents with abdominal pain and pelvic pain.   Patient states that she has been having abdominal pain and pelvic pain in the past 2 days, which has worsened today.  Initially the abdominal pain started in the left lower quadrant, now involving whole lower abdomen and pelvis.  The pain is constant 10 out of 10 in severity, sharp, nonradiating.   Patient has nausea and vomited once, no diarrhea.    Denies fever or chills.  Patient does not have chest pain, shortness of breath, cough.  No symptoms of UTI or unilateral weakness.  Denies vaginal discharge or bleeding  ED Course: pt was found to have WBC 18.3, potassium 2.6, renal function normal, negative pregnancy test, lipase 25, negative urinalysis, wet prep showed clue cells, temperature normal, heart rate is 99, 87, RR 25, 17, oxygen saturation 96 to 99% on room air.  Pelvic ultrasound is negative.  CT abdomen/pelvis showed sigmoid diverticulitis.  CT-abd/pelvis showed 1. acute sigmoid diverticulitis. There is a small amount of adjacent free fluid in the patient's without evidence of a well-formed drainable fluid collection or abscess.  2. The appendix is located in the right lower quadrant and is moderately dilated measuring up to approximately 1 cm in  diameter.   Assessment & Plan:    Principal Problem:   Sigmoid diverticulitis Active Problems:   Hypertension   GERD (gastroesophageal reflux disease)   Tobacco abuse    Sigmoid diverticulitis and possible sepsis:  -Ruling out sepsis: On admission WBC 18.3, tachycardia, tachypnea, L.A 0.8, 0.7 -Continue to complain of abdominal pain --somewhat improved from admission -Improved nausea, no vomiting this morning,  - N.p.o. >> advancing to clear liquid today -CT scan showed acute sigmoid diverticulitis without abscess formation.   -Leukocytosis 18.3 >> 15.2 >> 12.3 - CT scan also showed that the appendix is located in the right lower quadrant and is moderately dilated measuring up to approximately 1 cm in diameter. Her lower abdominal pain is diffused, not localized to McBurney's point. Will treat pt as acute diverticulitis now, if no improvement--> may need to consult surgeon.    -IV Cipro and Flagyl started in ED -prn morphine, Norco for pain -prn Zofran for nausea. -IVF: 2L of NS bolus in ED, followed by 125 cc/  Bacterial vaginosis -Wet prep showed clue cells, but no vaginal bleeding or discharge.  - Patient is on Flagyl which should cover BV  Hypertension: -Currently stable, -continue labetalol, holding HCTZ -PRN hydralazine  GERD (gastroesophageal reflux disease): -Continue IV PPI  Tobacco abuse: -nicotine patch -Patient was advised on smoking cessation  Hypokalemia -Repleting accordingly, checking magnesium level -k+ = 2.6 >> 3.6    DVT prophylaxis: SCD/Compression stockings and Heparin SQ Code Status:   Code Status: Full Code Family Communication: No family member present. Disposition Plan:  >3 days Consultants: None  Procedures:       Antimicrobials:  Anti-infectives (From admission, onward)   Start     Dose/Rate Route Frequency Ordered Stop   06/05/19 1600  ciprofloxacin (CIPRO) IVPB 400 mg     400 mg 200 mL/hr over 60 Minutes Intravenous  Every 12 hours 06/05/19 0459     06/05/19 1400  metroNIDAZOLE (FLAGYL) IVPB 500 mg     500 mg 100 mL/hr over 60 Minutes Intravenous Every 8 hours 06/05/19 0800     06/05/19 0415  ciprofloxacin (CIPRO) IVPB 400 mg     400 mg 200 mL/hr over 60 Minutes Intravenous  Once 06/05/19 0411 06/05/19 0614   06/05/19 0415  metroNIDAZOLE (FLAGYL) IVPB 500 mg     500 mg 100 mL/hr over 60 Minutes Intravenous  Once 06/05/19 0411 06/05/19 0715       Medication:  . atenolol  25 mg Oral Daily  . heparin  5,000 Units Subcutaneous Q8H  . nicotine  21 mg Transdermal Daily    acetaminophen, hydrALAZINE, HYDROcodone-acetaminophen, morphine injection, ondansetron **OR** ondansetron (ZOFRAN) IV     Objective:   Vitals:   06/05/19 0809 06/05/19 1515 06/05/19 2128 06/06/19 0522  BP: 119/77 102/60 (!) 107/55 (!) 150/89  Pulse: 87 67 73 89  Resp: 18 20 18 18   Temp: 99.9 F (37.7 C) 99.1 F (37.3 C) 98.6 F (37 C) 99.8 F (37.7 C)  TempSrc: Oral Oral Oral Oral  SpO2: 100% 99% 98% 98%  Weight:      Height:        Intake/Output Summary (Last 24 hours) at 06/06/2019 1047 Last data filed at 06/06/2019 1029 Gross per 24 hour  Intake 2653.21 ml  Output -  Net 2653.21 ml   Filed Weights   06/04/19 2230  Weight: 99.8 kg     Examination:   Physical Exam  BP (!) 150/89 (BP Location: Right Arm)   Pulse 89   Temp 99.8 F (37.7 C) (Oral)   Resp 18   Ht 5\' 5"  (1.651 m)   Wt 99.8 kg   SpO2 98%   BMI 36.61 kg/m   Constitution:  Alert, cooperative, no distress,  Psychiatric: Normal and stable mood and affect, cognition intact,   HEENT: Normocephalic, PERRL, otherwise with in Normal limits  Chest:Chest symmetric Cardio vascular:  S1/S2, RRR, No murmure, No Rubs or Gallops  pulmonary: Clear to auscultation bilaterally, respirations unlabored, negative wheezes / crackles Abdomen: Soft, diffuse abdominal tenderness, significant in lower quadrants, negative any rebound tenderness negative fluid  shifts, bowel sounds,no masses, no organomegaly Muscular skeletal: Limited exam - in bed, able to move all 4 extremities, Normal strength,  Neuro: CNII-XII intact. , normal motor and sensation, reflexes intact  Extremities: No pitting edema lower extremities, +2 pulses  Skin: Dry, warm to touch, negative for any Rashes, No open wounds     LABs:  CBC Latest Ref Rng & Units 06/06/2019 06/05/2019 06/04/2019  WBC 4.0 - 10.5 K/uL 12.3(H) 15.2(H) 18.3(H)  Hemoglobin 12.0 - 15.0 g/dL 11.1(L) 12.4 13.6  Hematocrit 36.0 - 46.0 % 34.1(L) 37.5 40.4  Platelets 150 - 400 K/uL 153 161 194  CMP Latest Ref Rng & Units 06/06/2019 06/05/2019 06/04/2019  Glucose 70 - 99 mg/dL 88 106(H) 133(H)  BUN 6 - 20 mg/dL 11 10 13   Creatinine 0.44 - 1.00 mg/dL 0.72 0.69 0.79  Sodium 135 - 145 mmol/L 137 137 138  Potassium 3.5 - 5.1 mmol/L 3.6 3.0(L) 2.6(LL)  Chloride 98 - 111 mmol/L 108 102 101  CO2 22 - 32 mmol/L 24 25 24   Calcium 8.9 - 10.3 mg/dL 7.8(L) 8.3(L) 9.3  Total Protein 6.5 - 8.1 g/dL - - 7.8  Total Bilirubin 0.3 - 1.2 mg/dL - - 0.8  Alkaline Phos 38 - 126 U/L - - 65  AST 15 - 41 U/L - - 12(L)  ALT 0 - 44 U/L - - 11     SIGNED: Deatra James, MD, FACP, FHM. [facility],  Pager [phone]  If 7PM-7AM, please contact night-coverage [email or web address] 06/06/2019, 10:47 AM

## 2019-06-07 DIAGNOSIS — Z720 Tobacco use: Secondary | ICD-10-CM

## 2019-06-07 DIAGNOSIS — K219 Gastro-esophageal reflux disease without esophagitis: Secondary | ICD-10-CM

## 2019-06-07 DIAGNOSIS — E876 Hypokalemia: Secondary | ICD-10-CM

## 2019-06-07 DIAGNOSIS — K5732 Diverticulitis of large intestine without perforation or abscess without bleeding: Principal | ICD-10-CM

## 2019-06-07 DIAGNOSIS — I10 Essential (primary) hypertension: Secondary | ICD-10-CM

## 2019-06-07 LAB — CBC WITH DIFFERENTIAL/PLATELET
Abs Immature Granulocytes: 0.04 10*3/uL (ref 0.00–0.07)
Basophils Absolute: 0 10*3/uL (ref 0.0–0.1)
Basophils Relative: 0 %
Eosinophils Absolute: 0.3 10*3/uL (ref 0.0–0.5)
Eosinophils Relative: 3 %
HCT: 34.2 % — ABNORMAL LOW (ref 36.0–46.0)
Hemoglobin: 11.4 g/dL — ABNORMAL LOW (ref 12.0–15.0)
Immature Granulocytes: 0 %
Lymphocytes Relative: 13 %
Lymphs Abs: 1.1 10*3/uL (ref 0.7–4.0)
MCH: 32.8 pg (ref 26.0–34.0)
MCHC: 33.3 g/dL (ref 30.0–36.0)
MCV: 98.3 fL (ref 80.0–100.0)
Monocytes Absolute: 0.7 10*3/uL (ref 0.1–1.0)
Monocytes Relative: 8 %
Neutro Abs: 6.7 10*3/uL (ref 1.7–7.7)
Neutrophils Relative %: 76 %
Platelets: 161 10*3/uL (ref 150–400)
RBC: 3.48 MIL/uL — ABNORMAL LOW (ref 3.87–5.11)
RDW: 13.9 % (ref 11.5–15.5)
WBC: 8.9 10*3/uL (ref 4.0–10.5)
nRBC: 0 % (ref 0.0–0.2)

## 2019-06-07 LAB — BASIC METABOLIC PANEL
Anion gap: 7 (ref 5–15)
BUN: 5 mg/dL — ABNORMAL LOW (ref 6–20)
CO2: 24 mmol/L (ref 22–32)
Calcium: 7.7 mg/dL — ABNORMAL LOW (ref 8.9–10.3)
Chloride: 106 mmol/L (ref 98–111)
Creatinine, Ser: 0.72 mg/dL (ref 0.44–1.00)
GFR calc Af Amer: >60 mL/min (ref >60)
GFR calc non Af Amer: >60 mL/min (ref >60)
Glucose, Bld: 104 mg/dL — ABNORMAL HIGH (ref 70–99)
Potassium: 3 mmol/L — ABNORMAL LOW (ref 3.5–5.1)
Sodium: 137 mmol/L (ref 135–145)

## 2019-06-07 LAB — NOVEL CORONAVIRUS, NAA (HOSP ORDER, SEND-OUT TO REF LAB; TAT 18-24 HRS): SARS-CoV-2, NAA: NOT DETECTED

## 2019-06-07 LAB — GC/CHLAMYDIA PROBE AMP (~~LOC~~) NOT AT ARMC
Chlamydia: NEGATIVE
Neisseria Gonorrhea: NEGATIVE

## 2019-06-07 MED ORDER — NICOTINE 14 MG/24HR TD PT24: 14.0000 mg | MEDICATED_PATCH | Freq: Every day | TRANSDERMAL | 0 refills | Status: DC

## 2019-06-07 MED ORDER — POTASSIUM CHLORIDE CRYS ER 20 MEQ PO TBCR
40.0000 meq | EXTENDED_RELEASE_TABLET | Freq: Once | ORAL | Status: AC
  Administered 2019-06-07: 08:00:00 40 meq via ORAL
  Filled 2019-06-07: qty 2

## 2019-06-07 MED ORDER — METRONIDAZOLE 500 MG PO TABS: 500.0000 mg | ORAL_TABLET | Freq: Three times a day (TID) | ORAL | 0 refills | Status: AC

## 2019-06-07 MED ORDER — HYDROCODONE-ACETAMINOPHEN 5-325 MG PO TABS: 1.0000 | ORAL_TABLET | ORAL | 0 refills | Status: DC | PRN

## 2019-06-07 MED ORDER — CIPROFLOXACIN HCL 500 MG PO TABS: 500.0000 mg | ORAL_TABLET | Freq: Two times a day (BID) | ORAL | 0 refills | Status: AC

## 2019-06-07 MED ORDER — POLYETHYLENE GLYCOL 3350 17 G PO PACK: 17.0000 g | PACK | Freq: Every day | ORAL | Status: DC | PRN

## 2019-06-07 MED FILL — metroNIDAZOLE 500 MG TABS: 500 | 5 days supply | Qty: 15 | Fill #0

## 2019-06-07 MED FILL — HYDROCODON-APAP 5-325: 5-325 | 2 days supply | Qty: 10 | Fill #0

## 2019-06-07 MED FILL — CIPROFLOXACIN HCL 500 MG TA: 500 | 5 days supply | Qty: 10 | Fill #0

## 2019-06-07 MED FILL — NICOTINE 14 MG/24HR PATCH: 14 | 14 days supply | Qty: 14 | Fill #0

## 2019-06-07 NOTE — Discharge Summary (Signed)
Physician Discharge Summary  Debbie Ball ZOX:[id number] DOB: May 02, 1966 DOA: 06/04/2019  PCP: Quitman Livings, MD  Admit date: 06/04/2019 Discharge date: 06/07/2019  Admitted From: home Disposition:  home   Recommendations for Outpatient Follow-up:  1. Needs colonoscopy in a few weeks  Discharge Condition:  stable   CODE STATUS:  Full code   Diet recommendation:  Low fiber diet  Consultations:  none    Discharge Diagnoses:  Principal Problem:   Sigmoid diverticulitis Active Problems:   Hypertension   GERD (gastroesophageal reflux disease)   Tobacco abuse   Brief Summary: Debbie Ball is a 53 y.o. female with medical history significant of HTN, GERD and smoking, who presents with abdominal pain and pelvic pain.  CT abd/pelvis revealed acute sigmoid diverticulitis She was started on Cipro and Flagyl  Hospital Course:  Acute sigmoid diverticulitis - the pain she presented with has improved and at this point she is mainly mildly sore - no nausea of vomiting - she can tolerate a diet now and can be switched to oral antibiotic to finish off the course  HTN - cont Atenolol- hold HCTZ for now  GERD - no longer using Pepcid or a PPI  Tobacco abuse  - will prescribe a nicotine patch as she is motivated to stop   Discharge Exam: Vitals:   06/06/19 2127 06/07/19 0617  BP: 119/70 128/80  Pulse: 72 76  Resp: 19 18  Temp: 98.3 F (36.8 C) 98.9 F (37.2 C)  SpO2: 100% 100%   Vitals:   06/06/19 0522 06/06/19 1353 06/06/19 2127 06/07/19 0617  BP: (!) 150/89 126/76 119/70 128/80  Pulse: 89 78 72 76  Resp: Temp: 99.8 F (37.7 C) 98.2 F (36.8 C) 98.3 F (36.8 C) 98.9 F (37.2 C)  TempSrc: Oral Axillary Oral Oral  SpO2: 98% 99% 100% 100%  Weight:      Height:        General: Pt is alert, awake, not in acute distress Cardiovascular: RRR, S1/S2 +, no rubs, no gallops Respiratory: CTA bilaterally, no wheezing, no rhonchi Abdominal: Soft, mild  tenderness in lower abdomen, bowel sounds + Extremities: no edema, no cyanosis   Discharge Instructions  Discharge Instructions    Diet - low sodium heart healthy   Complete by: As directed    Low fiber diet   Increase activity slowly   Complete by: As directed      Allergies as of 06/07/2019   No Known Allergies     Medication List    STOP taking these medications   acetaminophen 500 MG tablet Commonly known as: TYLENOL   famotidine 20 MG tablet Commonly known as: PEPCID   hydrochlorothiazide 25 MG tablet Commonly known as: HYDRODIURIL   hydrOXYzine 25 MG tablet Commonly known as: ATARAX/VISTARIL   mupirocin ointment 2 % Commonly known as: Bactroban   naproxen 500 MG tablet Commonly known as: NAPROSYN   predniSONE 50 MG tablet Commonly known as: DELTASONE   traMADol 50 MG tablet Commonly known as: ULTRAM   triamcinolone cream 0.1 % Commonly known as: KENALOG     TAKE these medications   albuterol 108 (90 Base) MCG/ACT inhaler Commonly known as: VENTOLIN HFA Inhale 1-2 puffs into the lungs every 6 (six) hours as needed for wheezing or shortness of breath.   atenolol 25 MG tablet Commonly known as: TENORMIN Take 1 tablet (25 mg total) by mouth daily.   ciprofloxacin 500 MG tablet Commonly known as: Cipro Take  1 tablet (500 mg total) by mouth 2 (two) times daily for 10 doses.   cyclobenzaprine 10 MG tablet Commonly known as: FLEXERIL Take 1 tablet (10 mg total) by mouth 3 (three) times daily as needed for muscle spasms.   HYDROcodone-acetaminophen 5-325 MG tablet Commonly known as: NORCO/VICODIN Take 1 tablet by mouth every 4 (four) hours as needed. What changed: how much to take   ibuprofen 200 MG tablet Commonly known as: ADVIL Take 400 mg by mouth every 6 (six) hours as needed for moderate pain. What changed: Another medication with the same name was removed. Continue taking this medication, and follow the directions you see here.    meloxicam 7.5 MG tablet Commonly known as: MOBIC Take 7.5 mg by mouth 2 (two) times daily.   metroNIDAZOLE 500 MG tablet Commonly known as: Flagyl Take 1 tablet (500 mg total) by mouth 3 (three) times daily for 5 days.   multivitamin with minerals tablet Take 1 tablet by mouth daily.   nicotine 14 mg/24hr patch Commonly known as: NICODERM CQ - dosed in mg/24 hours Place 1 patch (14 mg total) onto the skin daily.       No Known Allergies   Procedures/Studies:   US Transvaginal Non-ob  Result Date: 06/05/2019 CLINICAL DATA:  Initial evaluation for acute pelvic pain. EXAM: TRANSABDOMINAL AND TRANSVAGINAL ULTRASOUND OF PELVIS DOPPLER ULTRASOUND OF OVARIES TECHNIQUE: Both transabdominal and transvaginal ultrasound examinations of the pelvis were performed. Transabdominal technique was performed for global imaging of the pelvis including uterus, ovaries, adnexal regions, and pelvic cul-de-sac. It was necessary to proceed with endovaginal exam following the transabdominal exam to visualize the uterus, endometrium, and ovaries. Color and duplex Doppler ultrasound was utilized to evaluate blood flow to the ovaries. COMPARISON:  None. FINDINGS: Uterus Measurements: 9.3 x 3.6 x 4.4 cm = volume: 75.7 mL. No fibroids or other mass visualized. Endometrium Thickness: 4.7 mm.  No focal abnormality visualized. Right ovary Measurements: 2.3 x 1.6 x 2.0 cm = volume: 3.7 mL. Normal appearance/no adnexal mass. Left ovary Measurements: 3.1 x 1.0 x 2.0 cm = volume: 3.3 mL. Normal appearance/no adnexal mass. Pulsed Doppler evaluation of both ovaries demonstrates small volume free physiologic fluid present within the pelvis. Other findings No abnormal free fluid. IMPRESSION: Normal pelvic ultrasound. No evidence for ovarian torsion or other acute finding. Electronically Signed   By: Jeannine Boga M.D.   On: 06/05/2019 02:07   US Pelvis Complete  Result Date: 06/05/2019 CLINICAL DATA:  Initial  evaluation for acute pelvic pain. EXAM: TRANSABDOMINAL AND TRANSVAGINAL ULTRASOUND OF PELVIS DOPPLER ULTRASOUND OF OVARIES TECHNIQUE: Both transabdominal and transvaginal ultrasound examinations of the pelvis were performed. Transabdominal technique was performed for global imaging of the pelvis including uterus, ovaries, adnexal regions, and pelvic cul-de-sac. It was necessary to proceed with endovaginal exam following the transabdominal exam to visualize the uterus, endometrium, and ovaries. Color and duplex Doppler ultrasound was utilized to evaluate blood flow to the ovaries. COMPARISON:  None. FINDINGS: Uterus Measurements: 9.3 x 3.6 x 4.4 cm = volume: 75.7 mL. No fibroids or other mass visualized. Endometrium Thickness: 4.7 mm.  No focal abnormality visualized. Right ovary Measurements: 2.3 x 1.6 x 2.0 cm = volume: 3.7 mL. Normal appearance/no adnexal mass. Left ovary Measurements: 3.1 x 1.0 x 2.0 cm = volume: 3.3 mL. Normal appearance/no adnexal mass. Pulsed Doppler evaluation of both ovaries demonstrates small volume free physiologic fluid present within the pelvis. Other findings No abnormal free fluid. IMPRESSION: Normal pelvic ultrasound. No evidence  for ovarian torsion or other acute finding. Electronically Signed   By: Rise MuBenjamin  McClintock M.D.   On: 06/05/2019 02:07   Ct Abdomen Pelvis W Contrast  Result Date: 06/05/2019 CLINICAL DATA:  Pelvic pain x1 day EXAM: CT ABDOMEN AND PELVIS WITH CONTRAST TECHNIQUE: Multidetector CT imaging of the abdomen and pelvis was performed using the standard protocol following bolus administration of intravenous contrast. CONTRAST:  100mL OMNIPAQUE IOHEXOL 300 MG/ML  SOLN COMPARISON:  Ultrasound from same day FINDINGS: Lower chest: No acute abnormality. Hepatobiliary: No focal liver abnormality is seen. No gallstones, gallbladder wall thickening, or biliary dilatation. Pancreas: Unremarkable. No pancreatic ductal dilatation or surrounding inflammatory changes.  Spleen: Normal in size without focal abnormality. Adrenals/Urinary Tract: Adrenal glands are unremarkable. Kidneys are normal, without renal calculi, focal lesion, or hydronephrosis. Bladder is unremarkable. Stomach/Bowel: Overall findings are consistent with acute sigmoid diverticulitis. There are a few pockets of free fluid in the pelvis without evidence of a drainable fluid collection or abscess. The appendix is located in the right lower quadrant and is moderately dilated measuring up to approximately 1 cm in diameter. The stomach is unremarkable. There is no evidence of a small-bowel obstruction. Vascular/Lymphatic: No significant vascular findings are present. No enlarged abdominal or pelvic lymph nodes. Reproductive: Uterus and bilateral adnexa are unremarkable. Other: No abdominal wall hernia or abnormality. No abdominopelvic ascites. Musculoskeletal: No acute or significant osseous findings. IMPRESSION: 1. Findings consistent with acute sigmoid diverticulitis. There is a small amount of adjacent free fluid in the patient's without evidence of a well-formed drainable fluid collection or abscess. 2. Dilated appendix measuring 1 cm in the right lower quadrant without definite CT evidence of acute cholecystitis. Correlation with physical exam is recommended. Electronically Signed   By: Katherine Mantlehristopher  Green M.D.   On: 06/05/2019 03:30   [place] Art/ven Flow Abd Pelv Doppler  Result Date: 06/05/2019 CLINICAL DATA:  Initial evaluation for acute pelvic pain. EXAM: TRANSABDOMINAL AND TRANSVAGINAL ULTRASOUND OF PELVIS DOPPLER ULTRASOUND OF OVARIES TECHNIQUE: Both transabdominal and transvaginal ultrasound examinations of the pelvis were performed. Transabdominal technique was performed for global imaging of the pelvis including uterus, ovaries, adnexal regions, and pelvic cul-de-sac. It was necessary to proceed with endovaginal exam following the transabdominal exam to visualize the uterus, endometrium, and ovaries.  Color and duplex Doppler ultrasound was utilized to evaluate blood flow to the ovaries. COMPARISON:  None. FINDINGS: Uterus Measurements: 9.3 x 3.6 x 4.4 cm = volume: 75.7 mL. No fibroids or other mass visualized. Endometrium Thickness: 4.7 mm.  No focal abnormality visualized. Right ovary Measurements: 2.3 x 1.6 x 2.0 cm = volume: 3.7 mL. Normal appearance/no adnexal mass. Left ovary Measurements: 3.1 x 1.0 x 2.0 cm = volume: 3.3 mL. Normal appearance/no adnexal mass. Pulsed Doppler evaluation of both ovaries demonstrates small volume free physiologic fluid present within the pelvis. Other findings No abnormal free fluid. IMPRESSION: Normal pelvic ultrasound. No evidence for ovarian torsion or other acute finding. Electronically Signed   By: Rise MuBenjamin  McClintock M.D.   On: 06/05/2019 02:07     The results of significant diagnostics from this hospitalization (including imaging, microbiology, ancillary and laboratory) are listed below for reference.     Microbiology: Recent Results (from the past 240 hour(s))  Wet prep, genital     Status: Abnormal   Collection Time: 06/05/19  1:08 AM   Specimen: Cervix; Genital  Result Value Ref Range Status   Yeast Wet Prep HPF POC NONE SEEN NONE SEEN Final   Trich, Wet Prep NONE  SEEN NONE SEEN Final   Clue Cells Wet Prep HPF POC PRESENT (A) NONE SEEN Final   WBC, Wet Prep HPF POC MANY (A) NONE SEEN Final   Sperm NONE SEEN  Final    Comment: Performed at [facility], [address] [place], [place] [address]  Novel Coronavirus,NAA,(SEND-OUT TO REF LAB - TAT 24-48 hrs); Hosp Order     Status: None   Collection Time: 06/05/19  4:34 AM   Specimen: Nasopharyngeal Swab; Respiratory  Result Value Ref Range Status   SARS-CoV-2, NAA NOT DETECTED NOT DETECTED Final    Comment: (NOTE) This test was developed and its performance characteristics determined by [facility]. This test has not been FDA cleared or approved. This test has been authorized by  FDA under an Emergency Use Authorization (EUA). This test is only authorized for the duration of time the declaration that circumstances exist justifying the authorization of the emergency use of in vitro diagnostic tests for detection of SARS-CoV-2 virus and/or diagnosis of COVID-19 infection under section 564(b)(1) of the Act, 21 U.S.C. 604VWU-9(W)(1360bbb-3(b)(1), unless the authorization is terminated or revoked sooner. When diagnostic testing is negative, the possibility of a false negative result should be considered in the context of a patient's recent exposures and the presence of clinical signs and symptoms consistent with COVID-19. An individual without symptoms of COVID-19 and who is not shedding SARS-CoV-2 virus would expect to have a negative (not detected) result in this assay. Performed  At: [facility] [address] [place], [place] [id number] Jolene SchimkeNagendra Sanjai MD AO:[id number]    Coronavirus Source NASOPHARYNGEAL  Final    Comment: Performed at [facility], [address] [place], [place] [address]  Culture, blood (x 2)     Status: None (Preliminary result)   Collection Time: 06/05/19  8:31 AM   Specimen: BLOOD  Result Value Ref Range Status   Specimen Description BLOOD RIGHT ANTECUBITAL  Final   Special Requests   Final    BOTTLES DRAWN AEROBIC ONLY Blood Culture adequate volume   Culture   Final    NO GROWTH 1 DAY Performed at [facility], [address] [place], [place] [address]    Report Status PENDING  Incomplete  Culture, blood (x 2)     Status: None (Preliminary result)   Collection Time: 06/05/19  8:41 AM   Specimen: BLOOD LEFT HAND  Result Value Ref Range Status   Specimen Description BLOOD LEFT HAND  Final   Special Requests   Final    BOTTLES DRAWN AEROBIC ONLY Blood Culture results may not be optimal due to an inadequate volume of blood received in culture bottles   Culture   Final    NO GROWTH 1 DAY Performed at [facility], [address] [place], [place] [address]    Report Status PENDING  Incomplete     Labs: BNP (last 3 results) No results for input(s): BNP in the last 8760 hours. Basic Metabolic Panel: Recent Labs  Lab 06/04/19 2238 06/05/19 0836 06/06/19 0348 06/07/19 0309  NA 138 137 137 137  K 2.6* 3.0* 3.6 3.0*  CL 101 102 108 106  CO2 24 25 24 24   GLUCOSE 133* 106* 88 104*  BUN 13 10 11  5*  CREATININE 0.79 0.69 0.72 0.72  CALCIUM 9.3 8.3* 7.8* 7.7*  MG  --  1.8  --   --    Liver Function Tests: Recent Labs  Lab 06/04/19 2238  AST 12*  ALT 11  ALKPHOS 65  BILITOT 0.8  PROT 7.8  ALBUMIN 3.8   Recent Labs  Lab 06/04/19 2238  LIPASE 25   No results for input(s): AMMONIA in the last 168 hours. CBC: Recent Labs  Lab 06/04/19 2238 06/05/19 0836 06/06/19 0348 06/07/19 0309  WBC 18.3* 15.2* 12.3* 8.9  NEUTROABS  --   --  9.6* 6.7  HGB 13.6 12.4 11.1* 11.4*  HCT 40.4 37.5 34.1* 34.2*  MCV 96.4 97.7 98.6 98.3  PLT 194 161 153 161   Cardiac Enzymes: No results for input(s): CKTOTAL, CKMB, CKMBINDEX, TROPONINI in the last 168 hours. BNP: Invalid input(s): POCBNP CBG: Recent Labs  Lab 06/06/19 0732  GLUCAP 84   D-Dimer No results for input(s): DDIMER in the last 72 hours. Hgb A1c No results for input(s): HGBA1C in the last 72 hours. Lipid Profile No results for input(s): CHOL, HDL, LDLCALC, TRIG, CHOLHDL, LDLDIRECT in the last 72 hours. Thyroid function studies No results for input(s): TSH, T4TOTAL, T3FREE, THYROIDAB in the last 72 hours.  Invalid input(s): FREET3 Anemia work up No results for input(s): VITAMINB12, FOLATE, FERRITIN, TIBC, IRON, RETICCTPCT in the last 72 hours. Urinalysis    Component Value Date/Time   COLORURINE YELLOW 06/05/2019 0041   APPEARANCEUR CLEAR 06/05/2019 0041   LABSPEC 1.017 06/05/2019 0041   PHURINE 6.0 06/05/2019 0041   GLUCOSEU NEGATIVE 06/05/2019 0041   HGBUR SMALL (A) 06/05/2019 0041   BILIRUBINUR NEGATIVE 06/05/2019  0041   KETONESUR 5 (A) 06/05/2019 0041   PROTEINUR 30 (A) 06/05/2019 0041   NITRITE NEGATIVE 06/05/2019 0041   LEUKOCYTESUR NEGATIVE 06/05/2019 0041   Sepsis Labs Invalid input(s): PROCALCITONIN,  WBC,  LACTICIDVEN Microbiology Recent Results (from the past 240 hour(s))  Wet prep, genital     Status: Abnormal   Collection Time: 06/05/19  1:08 AM   Specimen: Cervix; Genital  Result Value Ref Range Status   Yeast Wet Prep HPF POC NONE SEEN NONE SEEN Final   Trich, Wet Prep NONE SEEN NONE SEEN Final   Clue Cells Wet Prep HPF POC PRESENT (A) NONE SEEN Final   WBC, Wet Prep HPF POC MANY (A) NONE SEEN Final   Sperm NONE SEEN  Final    Comment: Performed at [facility], [address] [place], [place] [address]  Novel Coronavirus,NAA,(SEND-OUT TO REF LAB - TAT 24-48 hrs); Hosp Order     Status: None   Collection Time: 06/05/19  4:34 AM   Specimen: Nasopharyngeal Swab; Respiratory  Result Value Ref Range Status   SARS-CoV-2, NAA NOT DETECTED NOT DETECTED Final    Comment: (NOTE) This test was developed and its performance characteristics determined by [facility]. This test has not been FDA cleared or approved. This test has been authorized by FDA under an Emergency Use Authorization (EUA). This test is only authorized for the duration of time the declaration that circumstances exist justifying the authorization of the emergency use of in vitro diagnostic tests for detection of SARS-CoV-2 virus and/or diagnosis of COVID-19 infection under section 564(b)(1) of the Act, 21 U.S.C. 604VWU-9(W)(1360bbb-3(b)(1), unless the authorization is terminated or revoked sooner. When diagnostic testing is negative, the possibility of a false negative result should be considered in the context of a patient's recent exposures and the presence of clinical signs and symptoms consistent with COVID-19. An individual without symptoms of COVID-19 and who is not shedding SARS-CoV-2 virus would expect  to have a negative (not detected) result in this assay.  Performed  At: [facility] [address] [place], [place] [id number] Jolene Schimke MD WU:[id number]    Coronavirus Source NASOPHARYNGEAL  Final    Comment: Performed at [facility], [address] [place], [place] [address]  Culture, blood (x 2)     Status: None (Preliminary result)   Collection Time: 06/05/19  8:31 AM   Specimen: BLOOD  Result Value Ref Range Status   Specimen Description BLOOD RIGHT ANTECUBITAL  Final   Special Requests   Final    BOTTLES DRAWN AEROBIC ONLY Blood Culture adequate volume   Culture   Final    NO GROWTH 1 DAY Performed at [facility], [address] [place], [place] [address]    Report Status PENDING  Incomplete  Culture, blood (x 2)     Status: None (Preliminary result)   Collection Time: 06/05/19  8:41 AM   Specimen: BLOOD LEFT HAND  Result Value Ref Range Status   Specimen Description BLOOD LEFT HAND  Final   Special Requests   Final    BOTTLES DRAWN AEROBIC ONLY Blood Culture results may not be optimal due to an inadequate volume of blood received in culture bottles   Culture   Final    NO GROWTH 1 DAY Performed at [facility], [address], [place], [place] [address]    Report Status PENDING  Incomplete     Time coordinating discharge in minutes: 60  SIGNED:   Calvert Cantor, MD  [facility] 06/07/2019, 8:41 AM Pager   If 7PM-7AM, please contact night-coverage [email or web address] Password [email or web address]

## 2019-06-07 NOTE — Progress Notes (Signed)
Discharged Pt to home, alert, oriented and stable. Instructions given and explained and belongings were returned accordingly.

## 2019-06-10 LAB — CULTURE, BLOOD (ROUTINE X 2)
Culture: NO GROWTH
Culture: NO GROWTH
Special Requests: ADEQUATE

## 2020-04-18 ENCOUNTER — Ambulatory Visit (HOSPITAL_COMMUNITY)
Admission: EM | Admit: 2020-04-18 | Discharge: 2020-04-18 | Disposition: A | Discharge status: Home / Self Care | Payer: Managed Care, Other (non HMO) | Attending: Family Medicine | Admitting: Family Medicine

## 2020-04-18 ENCOUNTER — Other Ambulatory Visit: Payer: Self-pay

## 2020-04-18 DIAGNOSIS — I10 Essential (primary) hypertension: Secondary | ICD-10-CM | POA: Diagnosis not present

## 2020-04-18 DIAGNOSIS — Z9109 Other allergy status, other than to drugs and biological substances: Secondary | ICD-10-CM

## 2020-04-18 MED ORDER — FLUTICASONE PROPIONATE 50 MCG/ACT NA SUSP: 1.0000 | Freq: Every day | NASAL | 0 refills | Status: DC

## 2020-04-18 MED ORDER — ATENOLOL 25 MG PO TABS: 25.0000 mg | ORAL_TABLET | Freq: Every day | ORAL | 1 refills | Status: DC

## 2020-04-18 NOTE — Discharge Instructions (Addendum)
Medicines refilled Return here as needed

## 2020-04-18 NOTE — ED Provider Notes (Signed)
[name]    CSN: [id number] Arrival date & time: 04/18/20  1621      History   Chief Complaint Chief Complaint  Patient presents with  . Medication Refill  . Hypertension    HPI Debbie Ball is a 54 y.o. female.   HPI  Patient is a 54 year old female.  She comes in today for refill request of atenolol 25 mg daily.  She has her other blood pressure medication HCTZ and does not need refill on that medication.  She will follow-up with PCP for future refills.  Patient's blood pressure today is 136/93.  Patient also has complaints of sinus pressure for about a week.  She has right-sided frontal sinus pressure, runny nose, drainage, nasal stuffiness, congestion, sneezing, itchy eyes, postnasal drip, cough, ears fullness.  She denies fever, ear pain/drainage. shortness of breath, wheezing, chest pain.  The only medication she is taking for allergies currently is Claritin.  Past Medical History:  Diagnosis Date  . GERD (gastroesophageal reflux disease)   . Hypertension   . Tobacco abuse     Patient Active Problem List   Diagnosis Date Noted  . Sigmoid diverticulitis 06/05/2019  . Hypertension   . GERD (gastroesophageal reflux disease)   . Tobacco abuse   . Hypokalemia     Past Surgical History:  Procedure Laterality Date  . INDUCED ABORTION      OB History    Gravida  4   Para  2   Term  2   Preterm      AB  1   Living  2     SAB      TAB  1   Ectopic      Multiple      Live Births               Home Medications    Prior to Admission medications   Medication Sig Start Date End Date Taking? Authorizing Provider  albuterol (PROVENTIL HFA;VENTOLIN HFA) 108 (90 Base) MCG/ACT inhaler Inhale 1-2 puffs into the lungs every 6 (six) hours as needed for wheezing or shortness of breath. 01/26/16   Konrad Felix, PA  atenolol (TENORMIN) 25 MG tablet Take 1 tablet (25 mg total) by mouth daily. 04/18/20   Raylene Everts, MD    cyclobenzaprine (FLEXERIL) 10 MG tablet Take 1 tablet (10 mg total) by mouth 3 (three) times daily as needed for muscle spasms. 01/11/19   Lawyer, Harrell Gave, PA-C  fluticasone (FLONASE) 50 MCG/ACT nasal spray Place 1 spray into both nostrils daily. 04/18/20   Raylene Everts, MD  HYDROcodone-acetaminophen (NORCO/VICODIN) 5-325 MG tablet Take 1 tablet by mouth every 4 (four) hours as needed. 06/07/19   Debbe Odea, MD  ibuprofen (ADVIL,MOTRIN) 200 MG tablet Take 400 mg by mouth every 6 (six) hours as needed for moderate pain.     [provider]  meloxicam (MOBIC) 7.5 MG tablet Take 7.5 mg by mouth 2 (two) times daily. 04/02/19   [provider]  Multiple Vitamins-Minerals (MULTIVITAMIN WITH MINERALS) tablet Take 1 tablet by mouth daily.    [provider]  nicotine (NICODERM CQ - DOSED IN MG/24 HOURS) 14 mg/24hr patch Place 1 patch (14 mg total) onto the skin daily. 06/07/19   Debbe Odea, MD    Family History Family History  Problem Relation Age of Onset  . Hypertension Father   . Hypertension Sister   . Other Neg Hx     Social History Social  History   Tobacco Use  . Smoking status: Current Every Day Smoker    Packs/day: 0.50    Years: 15.00    Pack years: 7.50  . Smokeless tobacco: Never Used  Substance Use Topics  . Alcohol use: Yes    Comment: Socially   . Drug use: No     Allergies   Patient has no known allergies.   Review of Systems Review of Systems  Constitutional: Negative for chills, fatigue and fever.  HENT: Positive for congestion, postnasal drip, rhinorrhea, sinus pressure and sneezing. Negative for hearing loss.   Eyes: Negative for pain.  Respiratory: Negative for cough, chest tightness, shortness of breath and wheezing.   Cardiovascular: Negative for chest pain and leg swelling.  Gastrointestinal: Negative for abdominal pain, constipation and diarrhea.  Musculoskeletal: Negative for myalgias.     Physical Exam Triage  Vital Signs ED Triage Vitals [04/18/20 1700]  Enc Vitals Group     BP (!) 136/93     Pulse Rate (!) 103     Resp 16     Temp 98.7 F (37.1 C)     Temp src      SpO2 96 %     Weight      Height      Head Circumference      Peak Flow      Pain Score 5     Pain Loc      Pain Edu?      Excl. in GC?    No data found.  Updated Vital Signs BP (!) 136/93   Pulse (!) 103   Temp 98.7 F (37.1 C)   Resp 16   LMP 03/17/2020   SpO2 96%      Physical Exam Constitutional:      General: She is not in acute distress.    Appearance: She is well-developed.  HENT:     Head: Normocephalic and atraumatic.     Comments: Right sided frontal sinus pressure.     Right Ear: Tympanic membrane normal.     Left Ear: Tympanic membrane normal.     Nose: Nose normal.     Mouth/Throat:     Mouth: Mucous membranes are moist.  Eyes:     Conjunctiva/sclera: Conjunctivae normal.     Pupils: Pupils are equal, round, and reactive to light.  Cardiovascular:     Rate and Rhythm: Regular rhythm. Tachycardia present.     Heart sounds: Normal heart sounds.  Pulmonary:     Effort: Pulmonary effort is normal. No respiratory distress.  Musculoskeletal:     Cervical back: Normal range of motion.  Skin:    General: Skin is warm and dry.  Neurological:     Mental Status: She is alert.  Psychiatric:        Mood and Affect: Mood normal.        Behavior: Behavior normal.      UC Treatments / Results  Labs (all labs ordered are listed, but only abnormal results are displayed) Labs Reviewed - No data to display  EKG   Radiology No results found.  Procedures Procedures (including critical care time)  Medications Ordered in UC Medications - No data to display  Initial Impression / Assessment and Plan / UC Course  I have reviewed the triage vital signs and the nursing notes.  Pertinent labs & imaging results that were available during my care of the patient were reviewed by me and considered  in my medical decision  making (see chart for details).      Final Clinical Impressions(s) / UC Diagnoses   Final diagnoses:  Essential hypertension  Environmental allergies     Discharge Instructions     Medicines refilled Return here as needed    ED Prescriptions    Medication Sig Dispense Auth. Provider   fluticasone (FLONASE) 50 MCG/ACT nasal spray Place 1 spray into both nostrils daily. 16 g Eustace Moore, MD   atenolol (TENORMIN) 25 MG tablet Take 1 tablet (25 mg total) by mouth daily. 30 tablet Eustace Moore, MD     PDMP not reviewed this encounter.   Eustace Moore, MD 04/18/20 2036

## 2020-04-18 NOTE — ED Triage Notes (Signed)
Pt here for high blood pressure, has been out of atenolol for 1 week.

## 2021-10-04 ENCOUNTER — Emergency Department (HOSPITAL_COMMUNITY): Payer: Managed Care, Other (non HMO)

## 2021-10-04 ENCOUNTER — Other Ambulatory Visit: Payer: Self-pay

## 2021-10-04 ENCOUNTER — Encounter (HOSPITAL_COMMUNITY): Payer: Self-pay | Admitting: Pharmacy Technician

## 2021-10-04 ENCOUNTER — Emergency Department (HOSPITAL_COMMUNITY)
Admission: EM | Admit: 2021-10-04 | Discharge: 2021-10-05 | Disposition: A | Discharge status: Home / Self Care | Payer: Managed Care, Other (non HMO) | Attending: Emergency Medicine | Admitting: Emergency Medicine

## 2021-10-04 ENCOUNTER — Ambulatory Visit
Admission: EM | Admit: 2021-10-04 | Discharge: 2021-10-04 | Discharge status: Against Medical Advice | Payer: Managed Care, Other (non HMO)

## 2021-10-04 DIAGNOSIS — I10 Essential (primary) hypertension: Secondary | ICD-10-CM | POA: Diagnosis present

## 2021-10-04 DIAGNOSIS — R002 Palpitations: Secondary | ICD-10-CM | POA: Diagnosis not present

## 2021-10-04 DIAGNOSIS — F17210 Nicotine dependence, cigarettes, uncomplicated: Secondary | ICD-10-CM | POA: Insufficient documentation

## 2021-10-04 DIAGNOSIS — I160 Hypertensive urgency: Secondary | ICD-10-CM | POA: Diagnosis not present

## 2021-10-04 DIAGNOSIS — Z79899 Other long term (current) drug therapy: Secondary | ICD-10-CM | POA: Insufficient documentation

## 2021-10-04 LAB — BASIC METABOLIC PANEL
Anion gap: 11 (ref 5–15)
BUN: 15 mg/dL (ref 6–20)
CO2: 27 mmol/L (ref 22–32)
Calcium: 9.8 mg/dL (ref 8.9–10.3)
Chloride: 100 mmol/L (ref 98–111)
Creatinine, Ser: 0.74 mg/dL (ref 0.44–1.00)
GFR, Estimated: >60 mL/min (ref >60)
Glucose, Bld: 91 mg/dL (ref 70–99)
Potassium: 3.1 mmol/L — ABNORMAL LOW (ref 3.5–5.1)
Sodium: 138 mmol/L (ref 135–145)

## 2021-10-04 LAB — CBC
HCT: 42.1 % (ref 36.0–46.0)
Hemoglobin: 13.7 g/dL (ref 12.0–15.0)
MCH: 32.6 pg (ref 26.0–34.0)
MCHC: 32.5 g/dL (ref 30.0–36.0)
MCV: 100.2 fL — ABNORMAL HIGH (ref 80.0–100.0)
Platelets: 161 10*3/uL (ref 150–400)
RBC: 4.2 MIL/uL (ref 3.87–5.11)
RDW: 14.5 % (ref 11.5–15.5)
WBC: 9.7 10*3/uL (ref 4.0–10.5)
nRBC: 0 % (ref 0.0–0.2)

## 2021-10-04 LAB — TROPONIN I (HIGH SENSITIVITY)
Troponin I (High Sensitivity): 4 ng/L (ref <18)
Troponin I (High Sensitivity): 5 ng/L (ref <18)

## 2021-10-04 LAB — I-STAT BETA HCG BLOOD, ED (MC, WL, AP ONLY): I-stat hCG, quantitative: <5 m[IU]/mL (ref <5)

## 2021-10-04 MED ORDER — ATENOLOL 25 MG PO TABS
25.0000 mg | ORAL_TABLET | Freq: Once | ORAL | Status: DC
  Filled 2021-10-04: qty 1

## 2021-10-04 NOTE — ED Provider Notes (Signed)
Emergency Medicine Provider Triage Evaluation Note  MONAI HINDES , a 55 y.o. female with history of hypertension was evaluated in triage.  Pt complains of left-sided chest pain without any radiation.  She was seen and evaluated at urgent care and had a blood pressure over 200 systolic.  She normally takes hydrochlorothiazide and atenolol but has been out of her atenolol for the last month.  She denies any shortness of breath, abdominal pain, nausea, vomiting, diarrhea.  Also complaining of some mild blurred vision and generalized headache.  Review of Systems  Positive:  Negative: See above  Physical Exam  BP (!) 173/98 (BP Location: Right Arm)   Pulse 95   Temp 98.7 F (37.1 C) (Oral)   Resp 18   SpO2 100%  Gen:   Awake, no distress   Resp:  Normal effort  MSK:   Moves extremities without difficulty  Other:    Medical Decision Making  Medically screening exam initiated at 2:30 PM.  Appropriate orders placed.  [name] was informed that the remainder of the evaluation will be completed by another provider, this initial triage assessment does not replace that evaluation, and the importance of remaining in the ED until their evaluation is complete.     Honor Loh [place], PA-C 10/04/21 1432    Tegeler, Canary Brim, MD 10/04/21 [phone]

## 2021-10-04 NOTE — Discharge Instructions (Signed)
Patient left urgent care via EMS.

## 2021-10-04 NOTE — ED Triage Notes (Signed)
Pt here from UC with hypertension and feeling "off" since yesterday. Pt out of atenolol. Hypertensive in triage.

## 2021-10-04 NOTE — ED Provider Notes (Signed)
[name]    CSN: [id number] Arrival date & time: 10/04/21  1046      History   Chief Complaint Chief Complaint  Patient presents with   Hypertension    HPI Debbie Ball is a 55 y.o. female.   Patient presents for concerns of abnormalities in blood pressure as she has "felt off" for the past 2 days.  Patient reports that she has felt off balance recently and just generally does not feel well.  Denies headache, blurred vision, dizziness, nausea, vomiting, chest pain, shortness of breath.  Patient does endorse palpitations that started today.  She reports that she has had palpitations previously.  Patient does take blood pressure medication which are hydrochlorothiazide and atenolol.  Patient reports that she ran out of atenolol approximately 1 month ago.  Takes atenolol for palpitations.  Has not been evaluated by cardiologist.   Hypertension   Past Medical History:  Diagnosis Date   GERD (gastroesophageal reflux disease)    Hypertension    Tobacco abuse     Patient Active Problem List   Diagnosis Date Noted   Sigmoid diverticulitis 06/05/2019   Hypertension    GERD (gastroesophageal reflux disease)    Tobacco abuse    Hypokalemia     Past Surgical History:  Procedure Laterality Date   INDUCED ABORTION      OB History     Gravida  4   Para  2   Term  2   Preterm      AB  1   Living  2      SAB      IAB  1   Ectopic      Multiple      Live Births               Home Medications    Prior to Admission medications   Medication Sig Start Date End Date Taking? Authorizing Provider  albuterol (PROVENTIL HFA;VENTOLIN HFA) 108 (90 Base) MCG/ACT inhaler Inhale 1-2 puffs into the lungs every 6 (six) hours as needed for wheezing or shortness of breath. 01/26/16   Tharon Aquas, PA  atenolol (TENORMIN) 25 MG tablet Take 1 tablet (25 mg total) by mouth daily. 04/18/20   Eustace Moore, MD  cyclobenzaprine (FLEXERIL) 10 MG tablet  Take 1 tablet (10 mg total) by mouth 3 (three) times daily as needed for muscle spasms. 01/11/19   Lawyer, Cristal Deer, PA-C  fluticasone (FLONASE) 50 MCG/ACT nasal spray Place 1 spray into both nostrils daily. 04/18/20   Eustace Moore, MD  hydrochlorothiazide (HYDRODIURIL) 25 MG tablet Take 25 mg by mouth daily. 09/10/21   [provider]  HYDROcodone-acetaminophen (NORCO/VICODIN) 5-325 MG tablet Take 1 tablet by mouth every 4 (four) hours as needed. 06/07/19   Calvert Cantor, MD  ibuprofen (ADVIL,MOTRIN) 200 MG tablet Take 400 mg by mouth every 6 (six) hours as needed for moderate pain.     [provider]  meloxicam (MOBIC) 7.5 MG tablet Take 7.5 mg by mouth 2 (two) times daily. 04/02/19   [provider]  Multiple Vitamins-Minerals (MULTIVITAMIN WITH MINERALS) tablet Take 1 tablet by mouth daily.    [provider]  nicotine (NICODERM CQ - DOSED IN MG/24 HOURS) 14 mg/24hr patch Place 1 patch (14 mg total) onto the skin daily. 06/07/19   Calvert Cantor, MD    Family History Family History  Problem Relation Age of Onset   Hypertension Father    Hypertension Sister  Other Neg Hx     Social History Social History   Tobacco Use   Smoking status: Every Day    Packs/day: 0.50    Years: 15.00    Pack years: 7.50    Types: Cigarettes   Smokeless tobacco: Never  Substance Use Topics   Alcohol use: Yes    Comment: Socially    Drug use: No     Allergies   Patient has no known allergies.   Review of Systems Review of Systems Per HPI  Physical Exam Triage Vital Signs ED Triage Vitals [10/04/21 1113]  Enc Vitals Group     BP (!) 171/115     Pulse Rate 82     Resp 18     Temp 98.1 F (36.7 C)     Temp Source Oral     SpO2 98 %     Weight      Height      Head Circumference      Peak Flow      Pain Score 0     Pain Loc      Pain Edu?      Excl. in GC?    No data found.  Updated Vital Signs BP (!) 200/92   Pulse (!) 114   Temp  98.1 F (36.7 C) (Oral)   Resp 18   SpO2 98%   Visual Acuity Right Eye Distance:   Left Eye Distance:   Bilateral Distance:    Right Eye Near:   Left Eye Near:    Bilateral Near:     Physical Exam Constitutional:      General: She is not in acute distress.    Appearance: Normal appearance. She is not toxic-appearing or diaphoretic.  HENT:     Head: Normocephalic and atraumatic.  Eyes:     Extraocular Movements: Extraocular movements intact.     Conjunctiva/sclera: Conjunctivae normal.     Pupils: Pupils are equal, round, and reactive to light.  Cardiovascular:     Rate and Rhythm: Regular rhythm. Tachycardia present.     Pulses: Normal pulses.     Heart sounds: Normal heart sounds.  Pulmonary:     Effort: Pulmonary effort is normal. No respiratory distress.     Breath sounds: Normal breath sounds.  Skin:    General: Skin is warm and dry.  Neurological:     General: No focal deficit present.     Mental Status: She is alert and oriented to person, place, and time. Mental status is at baseline.     Cranial Nerves: Cranial nerves are intact.     Sensory: Sensation is intact.     Motor: Motor function is intact.     Coordination: Coordination is intact.     Gait: Gait is intact.  Psychiatric:        Mood and Affect: Mood normal.        Behavior: Behavior normal.        Thought Content: Thought content normal.        Judgment: Judgment normal.     UC Treatments / Results  Labs (all labs ordered are listed, but only abnormal results are displayed) Labs Reviewed - No data to display  EKG   Radiology No results found.  Procedures Procedures (including critical care time)  Medications Ordered in UC Medications - No data to display  Initial Impression / Assessment and Plan / UC Course  I have reviewed the triage vital signs and the nursing notes.  Pertinent labs & imaging results that were available during my care of the patient were reviewed by me and  considered in my medical decision making (see chart for details).     Patient exhibiting signs of hypertensive urgency.  Also having palpitations.  EKG showed sinus tachycardia.  Blood pressure was significantly elevated and became elevated in the systolic 200s during physical exam in triage.  Patient was advised that she needs go the hospital for further evaluation and management via EMS.  Patient was originally agreeable to going to EMS, although patient was concerned that EMS was taking too long to arrive to transport her to the hospital.  Therefore, patient wished to have her fianc transport her to the hospital.  Patient was advised against this given patient's current symptoms and vital signs.  Patient voiced understanding and still wished to have fianc take her to the hospital.  Risks associated with not going to the hospital via ambulance were discussed with patient.  Patient voiced understanding.  Patient left to go to the hospital with family member. Final Clinical Impressions(s) / UC Diagnoses   Final diagnoses:  Palpitations  Hypertensive urgency     Discharge Instructions      Patient left urgent care via EMS.     ED Prescriptions   None    PDMP not reviewed this encounter.   Lance Muss, FNP 10/04/21 1210

## 2021-10-04 NOTE — ED Triage Notes (Signed)
Pt c/o feeling "off balance" since yesterday states she checked her BP 160/100 (self reported). States she takes HCTZ and atenolol but ran out of atenolol approx 1 month ago.

## 2021-10-05 MED ORDER — ATENOLOL 25 MG PO TABS: 25.0000 mg | ORAL_TABLET | Freq: Every day | ORAL | 0 refills | Status: DC

## 2021-10-05 NOTE — ED Provider Notes (Signed)
[facility] EMERGENCY DEPARTMENT Provider Note   CSN: [id number] Arrival date & time: 10/04/21  1231     History Chief Complaint  Patient presents with   Hypertension    Debbie Ball is a 55 y.o. female.  Patient is a 55 year old female with history of hypertension and GERD.  Patient presenting today for evaluation of elevated blood pressure.  She has been checking her blood pressure at work and today's readings were in the 170s and 180s.  This concerned her and she presented to urgent care for evaluation.  They in turn wanted her to come to the ER.  Patient received atenolol in the waiting room.  She has been out of this medication for the past month and tells me her primary doctor has closed since her last appointment.  She denies chest pain or difficulty breathing.  She denies any headache, fevers, or chills.  The history is provided by the patient.      Past Medical History:  Diagnosis Date   GERD (gastroesophageal reflux disease)    Hypertension    Tobacco abuse     Patient Active Problem List   Diagnosis Date Noted   Sigmoid diverticulitis 06/05/2019   Hypertension    GERD (gastroesophageal reflux disease)    Tobacco abuse    Hypokalemia     Past Surgical History:  Procedure Laterality Date   INDUCED ABORTION       OB History     Gravida  4   Para  2   Term  2   Preterm      AB  1   Living  2      SAB      IAB  1   Ectopic      Multiple      Live Births              Family History  Problem Relation Age of Onset   Hypertension Father    Hypertension Sister    Other Neg Hx     Social History   Tobacco Use   Smoking status: Every Day    Packs/day: 0.50    Years: 15.00    Pack years: 7.50    Types: Cigarettes   Smokeless tobacco: Never  Substance Use Topics   Alcohol use: Yes    Comment: Socially    Drug use: No    Home Medications Prior to Admission medications   Medication Sig Start Date End Date  Taking? Authorizing Provider  albuterol (PROVENTIL HFA;VENTOLIN HFA) 108 (90 Base) MCG/ACT inhaler Inhale 1-2 puffs into the lungs every 6 (six) hours as needed for wheezing or shortness of breath. 01/26/16   Tharon Aquas, PA  atenolol (TENORMIN) 25 MG tablet Take 1 tablet (25 mg total) by mouth daily. 04/18/20   Eustace Moore, MD  cyclobenzaprine (FLEXERIL) 10 MG tablet Take 1 tablet (10 mg total) by mouth 3 (three) times daily as needed for muscle spasms. 01/11/19   Lawyer, Cristal Deer, PA-C  fluticasone (FLONASE) 50 MCG/ACT nasal spray Place 1 spray into both nostrils daily. 04/18/20   Eustace Moore, MD  hydrochlorothiazide (HYDRODIURIL) 25 MG tablet Take 25 mg by mouth daily. 09/10/21   [provider]  HYDROcodone-acetaminophen (NORCO/VICODIN) 5-325 MG tablet Take 1 tablet by mouth every 4 (four) hours as needed. 06/07/19   Calvert Cantor, MD  ibuprofen (ADVIL,MOTRIN) 200 MG tablet Take 400 mg by mouth every 6 (six) hours as needed for moderate pain.  [provider]  meloxicam (MOBIC) 7.5 MG tablet Take 7.5 mg by mouth 2 (two) times daily. 04/02/19   [provider]  Multiple Vitamins-Minerals (MULTIVITAMIN WITH MINERALS) tablet Take 1 tablet by mouth daily.    [provider]  nicotine (NICODERM CQ - DOSED IN MG/24 HOURS) 14 mg/24hr patch Place 1 patch (14 mg total) onto the skin daily. 06/07/19   Calvert Cantor, MD    Allergies    Patient has no known allergies.  Review of Systems   Review of Systems  All other systems reviewed and are negative.  Physical Exam Updated Vital Signs BP (!) 173/107   Pulse 66   Temp 98.5 F (36.9 C) (Oral)   Resp 18   Ht 5\' 7"  (1.702 m)   Wt 97.1 kg   SpO2 100%   BMI 33.52 kg/m   Physical Exam Vitals and nursing note reviewed.  Constitutional:      General: She is not in acute distress.    Appearance: She is well-developed. She is not diaphoretic.  HENT:     Head: Normocephalic and atraumatic.   Cardiovascular:     Rate and Rhythm: Normal rate and regular rhythm.     Heart sounds: No murmur heard.   No friction rub. No gallop.  Pulmonary:     Effort: Pulmonary effort is normal. No respiratory distress.     Breath sounds: Normal breath sounds. No wheezing.  Abdominal:     General: Bowel sounds are normal. There is no distension.     Palpations: Abdomen is soft.     Tenderness: There is no abdominal tenderness.  Musculoskeletal:        General: Normal range of motion.     Cervical back: Normal range of motion and neck supple.  Skin:    General: Skin is warm and dry.  Neurological:     General: No focal deficit present.     Mental Status: She is alert and oriented to person, place, and time.     Cranial Nerves: No cranial nerve deficit.     Motor: No weakness.     Coordination: Coordination normal.     Gait: Gait normal.    ED Results / Procedures / Treatments   Labs (all labs ordered are listed, but only abnormal results are displayed) Labs Reviewed  BASIC METABOLIC PANEL - Abnormal; Notable for the following components:      Result Value   Potassium 3.1 (*)    All other components within normal limits  CBC - Abnormal; Notable for the following components:   MCV 100.2 (*)    All other components within normal limits  I-STAT BETA HCG BLOOD, ED ([facility], [facility], [facility] ONLY)  TROPONIN I (HIGH SENSITIVITY)  TROPONIN I (HIGH SENSITIVITY)    EKG EKG Interpretation  Date/Time:  Thursday October 04 2021 14:33:40 EDT Ventricular Rate:  71 PR Interval:  156 QRS Duration: 84 QT Interval:  410 QTC Calculation: 445 R Axis:   55 Text Interpretation: Normal sinus rhythm with sinus arrhythmia Confirmed by [id number] (Cathren Laine) on 10/04/2021 10:27:10 PM  Radiology DG Chest 2 View  Result Date: 10/04/2021 CLINICAL DATA:  Chest pain and hypertension x2 days. EXAM: CHEST - 2 VIEW COMPARISON:  January 16, 2015 FINDINGS: The heart size and mediastinal contours are within normal  limits. Both lungs are clear. Breast soft tissue attenuation artifact is noted overlying the bilateral lung bases. The visualized skeletal structures are unremarkable. IMPRESSION: No active cardiopulmonary disease. Electronically Signed  By: Aram Candela M.D.   On: 10/04/2021 15:22    Procedures Procedures   Medications Ordered in ED Medications  atenolol (TENORMIN) tablet 25 mg (has no administration in time range)    ED Course  I have reviewed the triage vital signs and the nursing notes.  Pertinent labs & imaging results that were available during my care of the patient were reviewed by me and considered in my medical decision making (see chart for details).    MDM Rules/Calculators/A&P  Patient sent here from urgent care for evaluation of elevated blood pressure.  She has been off of her atenolol for the past month due to her doctor's office closing.  Patient's work-up is unremarkable.  Her EKG is unchanged and troponin is negative.  Chest x-ray is clear.  Laboratory studies showed no evidence for endorgan damage resulting from her hypertension.  At this point, I feel as though patient can safely be discharged with return as needed.  I will refill her atenolol and have her monitor her blood pressures at home/work.  Final Clinical Impression(s) / ED Diagnoses Final diagnoses:  None    Rx / DC Orders ED Discharge Orders     None        Geoffery Lyons, MD 10/05/21 [phone]

## 2021-10-05 NOTE — Discharge Instructions (Addendum)
Resume taking atenolol as previously prescribed.  This medication has been refilled for you this evening.  Keep a record of your blood pressures at home and at work and take this with you to your next doctor's appointment.  Return to the emergency department if you develop severe headache, chest pain, difficulty breathing, or other new and concerning symptoms.

## 2021-11-03 ENCOUNTER — Ambulatory Visit (HOSPITAL_COMMUNITY)
Admission: EM | Admit: 2021-11-03 | Discharge: 2021-11-03 | Disposition: A | Discharge status: Home / Self Care | Payer: Managed Care, Other (non HMO) | Attending: Emergency Medicine | Admitting: Emergency Medicine

## 2021-11-03 ENCOUNTER — Encounter (HOSPITAL_COMMUNITY): Payer: Self-pay

## 2021-11-03 ENCOUNTER — Other Ambulatory Visit: Payer: Self-pay

## 2021-11-03 DIAGNOSIS — R059 Cough, unspecified: Secondary | ICD-10-CM

## 2021-11-03 DIAGNOSIS — T7840XD Allergy, unspecified, subsequent encounter: Secondary | ICD-10-CM

## 2021-11-03 DIAGNOSIS — J989 Respiratory disorder, unspecified: Secondary | ICD-10-CM | POA: Diagnosis not present

## 2021-11-03 DIAGNOSIS — T7840XA Allergy, unspecified, initial encounter: Secondary | ICD-10-CM | POA: Diagnosis not present

## 2021-11-03 DIAGNOSIS — I10 Essential (primary) hypertension: Secondary | ICD-10-CM | POA: Diagnosis not present

## 2021-11-03 MED ORDER — FLUTICASONE PROPIONATE 50 MCG/ACT NA SUSP: 2.0000 | Freq: Every day | NASAL | 2 refills | Status: DC

## 2021-11-03 MED ORDER — ATENOLOL 25 MG PO TABS: 25.0000 mg | ORAL_TABLET | Freq: Every day | ORAL | 2 refills | Status: DC

## 2021-11-03 MED ORDER — CETIRIZINE HCL 10 MG PO TABS: 10.0000 mg | ORAL_TABLET | Freq: Every day | ORAL | 2 refills | Status: AC

## 2021-11-03 MED ORDER — HYDROCHLOROTHIAZIDE 25 MG PO TABS: 25.0000 mg | ORAL_TABLET | Freq: Every day | ORAL | 2 refills | Status: DC

## 2021-11-03 MED ORDER — IPRATROPIUM BROMIDE 0.06 % NA SOLN: 2.0000 | Freq: Four times a day (QID) | NASAL | 2 refills | Status: DC

## 2021-11-03 NOTE — ED Triage Notes (Addendum)
Pt present cough with congestion and elevated blood pressure. Symptoms started with the cough a week ago. Pt state she is out of BP medication and her appointment for her PCP is schedule but its months out. Pt needs refill on Atenolol and Hydrodiuril

## 2021-11-03 NOTE — Discharge Instructions (Signed)
Your prescriptions for atenolol and hydrochlorothiazide have been renewed for you.  Please discuss long-term side effects of atenolol with your provider and see if they would be interested in changing you to a calcium channel blocker for better efficacy and reduce side effects.  For allergic rhinitis, I have renewed your prescriptions for Flonase and Zyrtec.  I also provided you with a prescription for a second nasal spray called Atrovent which is not habit-forming but highly effective at reducing inflammation and nasal drainage, combination of these 3 medications used daily such significantly reduce your cough in the next 2 to 3 days and will continue to keep your cough well controlled for as long as you use them regularly.

## 2021-11-03 NOTE — ED Provider Notes (Signed)
[place]    CSN: [id number] Arrival date & time: 11/03/21  1041   History   Chief Complaint Chief Complaint  Patient presents with   Cough   Hypertension   HPI Debbie Ball is a 55 y.o. female. Pt present cough with congestion and elevated blood pressure. Symptoms started with the cough a week ago, patient states she gets this every year around this time.  Patient states she spends prescribe Zyrtec and Flonase in the past, has them at home but has not been using them regularly.  Patient states she has not used them at all in the past week.  Patient states she is tried several over-the-counter cough and cold preparations with no relief.  Pt state she is out of BP medication and her appointment for her PCP is schedule but its months out. Pt requests refill of Atenolol and hydrochlorothiazide.  Patient states her last dose of atenolol was yesterday morning.  Patient does not recall when she ran out of hydrochlorothiazide.  Patient has had multiple ED visits for hypertension in the past several months.  Patient has not had regular follow-up with any primary care providers in the [facility].  Patient states she has of an appointment with a primary care provider in December and is asking for medication to last until then.  The history is provided by the patient.   Past Medical History:  Diagnosis Date   GERD (gastroesophageal reflux disease)    Hypertension    Tobacco abuse    Patient Active Problem List   Diagnosis Date Noted   Sigmoid diverticulitis 06/05/2019   Hypertension    GERD (gastroesophageal reflux disease)    Tobacco abuse    Hypokalemia    Past Surgical History:  Procedure Laterality Date   INDUCED ABORTION     OB History     Gravida  4   Para  2   Term  2   Preterm      AB  1   Living  2      SAB      IAB  1   Ectopic      Multiple      Live Births             Home Medications    Prior to Admission medications    Medication Sig Start Date End Date Taking? Authorizing Provider  atenolol (TENORMIN) 25 MG tablet Take 1 tablet (25 mg total) by mouth daily. 11/03/21 02/01/22 Yes Lynden Oxford Scales, PA-C  cetirizine (ZYRTEC ALLERGY) 10 MG tablet Take 1 tablet (10 mg total) by mouth at bedtime. 11/03/21 02/01/22 Yes Lynden Oxford Scales, PA-C  fluticasone (FLONASE) 50 MCG/ACT nasal spray Place 2 sprays into both nostrils daily. 11/03/21 02/01/22 Yes Lynden Oxford Scales, PA-C  hydrochlorothiazide (HYDRODIURIL) 25 MG tablet Take 1 tablet (25 mg total) by mouth daily. 11/03/21 02/01/22 Yes Lynden Oxford Scales, PA-C  ipratropium (ATROVENT) 0.06 % nasal spray Place 2 sprays into both nostrils 4 (four) times daily. 11/03/21 12/03/21 Yes Lynden Oxford Scales, PA-C  Multiple Vitamins-Minerals (MULTIVITAMIN WITH MINERALS) tablet Take 1 tablet by mouth daily.    [provider]  nicotine (NICODERM CQ - DOSED IN MG/24 HOURS) 14 mg/24hr patch Place 1 patch (14 mg total) onto the skin daily. 06/07/19   Debbe Odea, MD   Family History Family History  Problem Relation Age of Onset   Hypertension Father    Hypertension Sister    Other Neg Hx  Social History Social History   Tobacco Use   Smoking status: Every Day    Packs/day: 0.50    Years: 15.00    Pack years: 7.50    Types: Cigarettes   Smokeless tobacco: Never  Substance Use Topics   Alcohol use: Yes    Comment: Socially    Drug use: No   Allergies   Patient has no known allergies.  Review of Systems Review of Systems Pertinent findings noted in history of present illness.   Physical Exam Triage Vital Signs ED Triage Vitals  Enc Vitals Group     BP 10/19/21 0827 (!) 147/82     Pulse Rate 10/19/21 0827 72     Resp 10/19/21 0827 18     Temp 10/19/21 0827 98.3 F (36.8 C)     Temp Source 10/19/21 0827 Oral     SpO2 10/19/21 0827 98 %     Weight --      Height --      Head Circumference --      Peak Flow --      Pain  Score 10/19/21 0826 5     Pain Loc --      Pain Edu? --      Excl. in GC? --    No data found.  Updated Vital Signs BP (!) 149/94 (BP Location: Left Arm)   Pulse 75   Temp 97.8 F (36.6 C) (Oral)   Resp 16   SpO2 97%   Visual Acuity Right Eye Distance:   Left Eye Distance:   Bilateral Distance:    Right Eye Near:   Left Eye Near:    Bilateral Near:     Physical Exam Vitals and nursing note reviewed.  Constitutional:      General: She is not in acute distress.    Appearance: Normal appearance. She is not ill-appearing.  HENT:     Head: Normocephalic and atraumatic.     Salivary Glands: Right salivary gland is not diffusely enlarged or tender. Left salivary gland is not diffusely enlarged or tender.     Right Ear: Ear canal and external ear normal. No drainage. A middle ear effusion is present. There is no impacted cerumen. Tympanic membrane is bulging. Tympanic membrane is not erythematous.     Left Ear: Ear canal and external ear normal. No drainage. A middle ear effusion is present. There is no impacted cerumen. Tympanic membrane is bulging. Tympanic membrane is not erythematous.     Ears:     Comments: Both TMs bulging with serous effusion, no erythema appreciated.    Nose: Rhinorrhea present. No nasal deformity, septal deviation, mucosal edema or congestion. Rhinorrhea is clear.     Right Turbinates: Enlarged and swollen. Not pale.     Left Turbinates: Enlarged and swollen. Not pale.     Right Sinus: No maxillary sinus tenderness or frontal sinus tenderness.     Left Sinus: No maxillary sinus tenderness or frontal sinus tenderness.     Mouth/Throat:     Lips: Pink. No lesions.     Mouth: Mucous membranes are moist. No oral lesions.     Pharynx: Oropharynx is clear. Uvula midline. No posterior oropharyngeal erythema or uvula swelling.     Tonsils: No tonsillar exudate. 0 on the right. 0 on the left.     Comments: Postnasal drip Eyes:     General: Lids are normal.         Right eye: No discharge.  Left eye: No discharge.     Extraocular Movements: Extraocular movements intact.     Conjunctiva/sclera: Conjunctivae normal.     Right eye: Right conjunctiva is not injected.     Left eye: Left conjunctiva is not injected.  Neck:     Trachea: Trachea and phonation normal.  Cardiovascular:     Rate and Rhythm: Normal rate and regular rhythm.     Pulses: Normal pulses.     Heart sounds: Normal heart sounds. No murmur heard.   No friction rub. No gallop.  Pulmonary:     Effort: Pulmonary effort is normal. No accessory muscle usage, prolonged expiration or respiratory distress.     Breath sounds: Normal breath sounds. No stridor, decreased air movement or transmitted upper airway sounds. No decreased breath sounds, wheezing, rhonchi or rales.  Chest:     Chest wall: No tenderness.  Abdominal:     General: Abdomen is flat. Bowel sounds are normal. There is no distension.     Palpations: Abdomen is soft.     Tenderness: There is no abdominal tenderness. There is no right CVA tenderness or left CVA tenderness.     Hernia: No hernia is present.  Musculoskeletal:        General: Normal range of motion.     Cervical back: Normal range of motion and neck supple. Normal range of motion.  Lymphadenopathy:     Cervical: No cervical adenopathy.  Skin:    General: Skin is warm and dry.     Findings: No erythema or rash.  Neurological:     General: No focal deficit present.     Mental Status: She is alert and oriented to person, place, and time.  Psychiatric:        Mood and Affect: Mood normal.        Behavior: Behavior normal.   UC Treatments / Results  Labs (all labs ordered are listed, but only abnormal results are displayed)  Labs Reviewed - No data to display  EKG  Radiology No results found.  Procedures Procedures (including critical care time)  Medications Ordered in UC Medications - No data to display  Initial Impression / Assessment  and Plan / UC Course  I have reviewed the triage vital signs and the nursing notes.  Pertinent labs & imaging results that were available during my care of the patient were reviewed by me and considered in my medical decision making (see chart for details).      Atenolol and hydrochlorothiazide have been renewed for patient.  Discussed with patient efficacy of atenolol and possible improvement of blood pressure control by switching to a calcium channel blocker.  Patient states she would like to discuss this with her primary care provider when she has her first appointment.  Patient's prescriptions for Flonase and Zyrtec have been renewed, have also added Atrovent for more rapid relief of her edematous turbinates and rhinorrhea.  Final Clinical Impressions(s) / UC Diagnoses   Final diagnoses:  Elevated blood pressure reading with diagnosis of hypertension  Cough, unspecified type  Allergic disorder of respiratory system, subsequent encounter     Discharge Instructions      Your prescriptions for atenolol and hydrochlorothiazide have been renewed for you.  Please discuss long-term side effects of atenolol with your provider and see if they would be interested in changing you to a calcium channel blocker for better efficacy and reduce side effects.  For allergic rhinitis, I have renewed your prescriptions for Flonase and Zyrtec.  I also provided you with a prescription for a second nasal spray called Atrovent which is not habit-forming but highly effective at reducing inflammation and nasal drainage, combination of these 3 medications used daily such significantly reduce your cough in the next 2 to 3 days and will continue to keep your cough well controlled for as long as you use them regularly.     ED Prescriptions     Medication Sig Dispense Auth. Provider   hydrochlorothiazide (HYDRODIURIL) 25 MG tablet Take 1 tablet (25 mg total) by mouth daily. 30 tablet Lynden Oxford Scales, PA-C    atenolol (TENORMIN) 25 MG tablet Take 1 tablet (25 mg total) by mouth daily. 30 tablet Lynden Oxford Scales, PA-C   fluticasone (FLONASE) 50 MCG/ACT nasal spray Place 2 sprays into both nostrils daily. 18 mL Lynden Oxford Scales, PA-C   cetirizine (ZYRTEC ALLERGY) 10 MG tablet Take 1 tablet (10 mg total) by mouth at bedtime. 30 tablet Lynden Oxford Scales, PA-C   ipratropium (ATROVENT) 0.06 % nasal spray Place 2 sprays into both nostrils 4 (four) times daily. 15 mL Lynden Oxford Scales, PA-C      PDMP not reviewed this encounter.  Disposition Upon Discharge:  Patient presented with an acute illness with associated systemic symptoms and significant discomfort requiring urgent management. In my opinion, this is a condition that a prudent lay person (someone who possesses an average knowledge of health and medicine) may potentially expect to result in complications if not addressed urgently such as respiratory distress, impairment of bodily function or dysfunction of bodily organs.   Routine symptom specific, illness specific and/or disease specific instructions were discussed with the patient and/or caregiver at length.   As such, the patient has been evaluated and assessed, work-up was performed and treatment was provided in alignment with urgent care protocols and evidence based medicine.  Patient/parent/caregiver has been advised that the patient may require follow up for further testing and treatment if the symptoms continue in spite of treatment, as clinically indicated and appropriate.  Patient/parent/caregiver has been advised to return to the [facility] or PCP in 3-5 days if no better; to PCP or the Emergency Department if new signs and symptoms develop, or if the current signs or symptoms continue to change or worsen for further workup, evaluation and treatment as clinically indicated and appropriate  The patient will follow up with their current PCP if and as advised. If the patient  does not currently have a PCP we will assist them in obtaining one.   Patient/parent/caregiver verbalized understanding and agreement of plan as discussed.  All questions were addressed during visit.  Please see discharge instructions below for further details of plan.  Condition: stable for discharge home Home: take medications as prescribed; routine discharge instructions as discussed; follow up as advised.    Lynden Oxford Scales, PA-C 11/03/21 1356

## 2021-12-24 ENCOUNTER — Encounter (HOSPITAL_COMMUNITY): Payer: Self-pay | Admitting: Emergency Medicine

## 2021-12-24 ENCOUNTER — Other Ambulatory Visit: Payer: Self-pay

## 2021-12-24 ENCOUNTER — Inpatient Hospital Stay (HOSPITAL_COMMUNITY)
Admission: EM | Admit: 2021-12-24 | Discharge: 2021-12-27 | DRG: 392 | Disposition: A | Discharge status: Home / Self Care | Payer: Managed Care, Other (non HMO) | Attending: Internal Medicine | Admitting: Internal Medicine

## 2021-12-24 ENCOUNTER — Emergency Department (HOSPITAL_COMMUNITY): Payer: Managed Care, Other (non HMO)

## 2021-12-24 DIAGNOSIS — E876 Hypokalemia: Secondary | ICD-10-CM | POA: Diagnosis present

## 2021-12-24 DIAGNOSIS — K5720 Diverticulitis of large intestine with perforation and abscess without bleeding: Principal | ICD-10-CM | POA: Diagnosis present

## 2021-12-24 DIAGNOSIS — K5792 Diverticulitis of intestine, part unspecified, without perforation or abscess without bleeding: Secondary | ICD-10-CM | POA: Diagnosis present

## 2021-12-24 DIAGNOSIS — Z8249 Family history of ischemic heart disease and other diseases of the circulatory system: Secondary | ICD-10-CM

## 2021-12-24 DIAGNOSIS — D696 Thrombocytopenia, unspecified: Secondary | ICD-10-CM | POA: Diagnosis present

## 2021-12-24 DIAGNOSIS — K219 Gastro-esophageal reflux disease without esophagitis: Secondary | ICD-10-CM | POA: Diagnosis present

## 2021-12-24 DIAGNOSIS — R1032 Left lower quadrant pain: Secondary | ICD-10-CM | POA: Diagnosis not present

## 2021-12-24 DIAGNOSIS — Z79899 Other long term (current) drug therapy: Secondary | ICD-10-CM

## 2021-12-24 DIAGNOSIS — F17210 Nicotine dependence, cigarettes, uncomplicated: Secondary | ICD-10-CM | POA: Diagnosis present

## 2021-12-24 DIAGNOSIS — Z20822 Contact with and (suspected) exposure to covid-19: Secondary | ICD-10-CM | POA: Diagnosis present

## 2021-12-24 DIAGNOSIS — I10 Essential (primary) hypertension: Secondary | ICD-10-CM | POA: Diagnosis present

## 2021-12-24 LAB — CBC
HCT: 39.6 % (ref 36.0–46.0)
Hemoglobin: 13.4 g/dL (ref 12.0–15.0)
MCH: 32.9 pg (ref 26.0–34.0)
MCHC: 33.8 g/dL (ref 30.0–36.0)
MCV: 97.3 fL (ref 80.0–100.0)
Platelets: 138 10*3/uL — ABNORMAL LOW (ref 150–400)
RBC: 4.07 MIL/uL (ref 3.87–5.11)
RDW: 15 % (ref 11.5–15.5)
WBC: 14.6 10*3/uL — ABNORMAL HIGH (ref 4.0–10.5)
nRBC: 0 % (ref 0.0–0.2)

## 2021-12-24 LAB — COMPREHENSIVE METABOLIC PANEL
ALT: 15 U/L (ref 0–44)
AST: 21 U/L (ref 15–41)
Albumin: 3.8 g/dL (ref 3.5–5.0)
Alkaline Phosphatase: 74 U/L (ref 38–126)
Anion gap: 11 (ref 5–15)
BUN: 18 mg/dL (ref 6–20)
CO2: 27 mmol/L (ref 22–32)
Calcium: 9.2 mg/dL (ref 8.9–10.3)
Chloride: 101 mmol/L (ref 98–111)
Creatinine, Ser: 0.74 mg/dL (ref 0.44–1.00)
GFR, Estimated: >60 mL/min (ref >60)
Glucose, Bld: 131 mg/dL — ABNORMAL HIGH (ref 70–99)
Potassium: 3 mmol/L — ABNORMAL LOW (ref 3.5–5.1)
Sodium: 139 mmol/L (ref 135–145)
Total Bilirubin: 1.1 mg/dL (ref 0.3–1.2)
Total Protein: 7.1 g/dL (ref 6.5–8.1)

## 2021-12-24 LAB — I-STAT BETA HCG BLOOD, ED (MC, WL, AP ONLY): I-stat hCG, quantitative: <5 m[IU]/mL (ref <5)

## 2021-12-24 LAB — LIPASE, BLOOD: Lipase: 26 U/L (ref 11–51)

## 2021-12-24 MED ORDER — IOHEXOL 300 MG/ML  SOLN
100.0000 mL | Freq: Once | INTRAMUSCULAR | Status: AC | PRN
  Administered 2021-12-24: 100 mL via INTRAVENOUS

## 2021-12-24 NOTE — ED Triage Notes (Signed)
Pt c/o abdominal pain and rectal pain/pressure x 2 days.Denies nausea/vomiting. Hx diverticulitis, states it feels similar.

## 2021-12-24 NOTE — ED Provider Notes (Signed)
Emergency Medicine Provider Triage Evaluation Note  Debbie Ball , a 56 y.o. female  was evaluated in triage.  Pt complains of left lower quadrant abdominal pain and rectal pain for the last 2 days.  Patient has a history of diverticulitis and states that this feels similar.  Reports associated chills, nausea, vomiting.  No fever or urinary symptoms.  Review of Systems  Positive:  Negative: See above   Physical Exam  BP 128/70 (BP Location: Left Arm)    Pulse 81    Temp 99.6 F (37.6 C) (Oral)    Resp 16    SpO2 100%  Gen:   Awake, no distress   Resp:  Normal effort  MSK:   Moves extremities without difficulty  Other:  LLQ abdominal tenderness   Medical Decision Making  Medically screening exam initiated at 4:03 PM.  Appropriate orders placed.  Debbie Ball was informed that the remainder of the evaluation will be completed by another provider, this initial triage assessment does not replace that evaluation, and the importance of remaining in the ED until their evaluation is complete.     Teressa Lower, PA-C 12/24/21 1604    Sloan Leiter, DO 12/24/21 2342

## 2021-12-25 ENCOUNTER — Encounter (HOSPITAL_COMMUNITY): Payer: Self-pay | Admitting: Family Medicine

## 2021-12-25 DIAGNOSIS — R1032 Left lower quadrant pain: Secondary | ICD-10-CM | POA: Diagnosis present

## 2021-12-25 DIAGNOSIS — K5720 Diverticulitis of large intestine with perforation and abscess without bleeding: Secondary | ICD-10-CM | POA: Diagnosis present

## 2021-12-25 DIAGNOSIS — I10 Essential (primary) hypertension: Secondary | ICD-10-CM

## 2021-12-25 DIAGNOSIS — K219 Gastro-esophageal reflux disease without esophagitis: Secondary | ICD-10-CM | POA: Diagnosis present

## 2021-12-25 DIAGNOSIS — D696 Thrombocytopenia, unspecified: Secondary | ICD-10-CM | POA: Diagnosis present

## 2021-12-25 DIAGNOSIS — Z8249 Family history of ischemic heart disease and other diseases of the circulatory system: Secondary | ICD-10-CM | POA: Diagnosis not present

## 2021-12-25 DIAGNOSIS — Z79899 Other long term (current) drug therapy: Secondary | ICD-10-CM | POA: Diagnosis not present

## 2021-12-25 DIAGNOSIS — K5792 Diverticulitis of intestine, part unspecified, without perforation or abscess without bleeding: Secondary | ICD-10-CM

## 2021-12-25 DIAGNOSIS — E876 Hypokalemia: Secondary | ICD-10-CM

## 2021-12-25 DIAGNOSIS — Z20822 Contact with and (suspected) exposure to covid-19: Secondary | ICD-10-CM | POA: Diagnosis present

## 2021-12-25 DIAGNOSIS — F17210 Nicotine dependence, cigarettes, uncomplicated: Secondary | ICD-10-CM | POA: Diagnosis present

## 2021-12-25 LAB — BASIC METABOLIC PANEL
Anion gap: 10 (ref 5–15)
BUN: 15 mg/dL (ref 6–20)
CO2: 29 mmol/L (ref 22–32)
Calcium: 8.6 mg/dL — ABNORMAL LOW (ref 8.9–10.3)
Chloride: 98 mmol/L (ref 98–111)
Creatinine, Ser: 0.86 mg/dL (ref 0.44–1.00)
GFR, Estimated: >60 mL/min (ref >60)
Glucose, Bld: 114 mg/dL — ABNORMAL HIGH (ref 70–99)
Potassium: 2.6 mmol/L — CL (ref 3.5–5.1)
Sodium: 137 mmol/L (ref 135–145)

## 2021-12-25 LAB — RESP PANEL BY RT-PCR (FLU A&B, COVID) ARPGX2
Influenza A by PCR: NEGATIVE
Influenza B by PCR: NEGATIVE
SARS Coronavirus 2 by RT PCR: NEGATIVE

## 2021-12-25 LAB — CBC
HCT: 36.2 % (ref 36.0–46.0)
Hemoglobin: 12.7 g/dL (ref 12.0–15.0)
MCH: 33.5 pg (ref 26.0–34.0)
MCHC: 35.1 g/dL (ref 30.0–36.0)
MCV: 95.5 fL (ref 80.0–100.0)
Platelets: 130 10*3/uL — ABNORMAL LOW (ref 150–400)
RBC: 3.79 MIL/uL — ABNORMAL LOW (ref 3.87–5.11)
RDW: 15 % (ref 11.5–15.5)
WBC: 15.1 10*3/uL — ABNORMAL HIGH (ref 4.0–10.5)
nRBC: 0 % (ref 0.0–0.2)

## 2021-12-25 LAB — HIV ANTIBODY (ROUTINE TESTING W REFLEX): HIV Screen 4th Generation wRfx: NONREACTIVE

## 2021-12-25 LAB — MAGNESIUM: Magnesium: 1.6 mg/dL — ABNORMAL LOW (ref 1.7–2.4)

## 2021-12-25 MED ORDER — MORPHINE SULFATE (PF) 4 MG/ML IV SOLN
4.0000 mg | Freq: Once | INTRAVENOUS | Status: AC
  Administered 2021-12-25: 4 mg via INTRAVENOUS
  Filled 2021-12-25: qty 1

## 2021-12-25 MED ORDER — ACETAMINOPHEN 325 MG PO TABS
650.0000 mg | ORAL_TABLET | Freq: Four times a day (QID) | ORAL | Status: DC | PRN
  Administered 2021-12-25: 650 mg via ORAL
  Filled 2021-12-25: qty 2

## 2021-12-25 MED ORDER — ONDANSETRON HCL 4 MG/2ML IJ SOLN
4.0000 mg | Freq: Once | INTRAMUSCULAR | Status: AC
Start: 2021-12-25 — End: 2021-12-25
  Administered 2021-12-25: 4 mg via INTRAVENOUS
  Filled 2021-12-25: qty 2

## 2021-12-25 MED ORDER — HYDROCODONE-ACETAMINOPHEN 5-325 MG PO TABS
1.0000 | ORAL_TABLET | ORAL | Status: DC | PRN
  Administered 2021-12-25: 1 via ORAL
  Administered 2021-12-26 – 2021-12-27 (×4): 2 via ORAL
  Filled 2021-12-25 (×5): qty 2

## 2021-12-25 MED ORDER — POTASSIUM CHLORIDE 10 MEQ/100ML IV SOLN
10.0000 meq | Freq: Once | INTRAVENOUS | Status: AC
  Administered 2021-12-25: 10 meq via INTRAVENOUS
  Filled 2021-12-25: qty 100

## 2021-12-25 MED ORDER — POTASSIUM CHLORIDE 10 MEQ/100ML IV SOLN
10.0000 meq | INTRAVENOUS | Status: AC
  Administered 2021-12-25 (×6): 10 meq via INTRAVENOUS
  Filled 2021-12-25 (×6): qty 100

## 2021-12-25 MED ORDER — HYDROMORPHONE HCL 1 MG/ML IJ SOLN
1.0000 mg | INTRAMUSCULAR | Status: DC | PRN
  Administered 2021-12-25 (×2): 1 mg via INTRAVENOUS
  Filled 2021-12-25 (×2): qty 1

## 2021-12-25 MED ORDER — PIPERACILLIN-TAZOBACTAM 3.375 G IVPB 30 MIN
3.3750 g | Freq: Once | INTRAVENOUS | Status: AC
  Administered 2021-12-25: 3.375 g via INTRAVENOUS
  Filled 2021-12-25: qty 50

## 2021-12-25 MED ORDER — ENOXAPARIN SODIUM 40 MG/0.4ML IJ SOSY
40.0000 mg | PREFILLED_SYRINGE | INTRAMUSCULAR | Status: DC
  Administered 2021-12-25 – 2021-12-27 (×3): 40 mg via SUBCUTANEOUS
  Filled 2021-12-25 (×3): qty 0.4

## 2021-12-25 MED ORDER — MAGNESIUM SULFATE 2 GM/50ML IV SOLN
2.0000 g | Freq: Once | INTRAVENOUS | Status: AC
  Administered 2021-12-25: 2 g via INTRAVENOUS
  Filled 2021-12-25: qty 50

## 2021-12-25 MED ORDER — PIPERACILLIN-TAZOBACTAM 3.375 G IVPB
3.3750 g | Freq: Three times a day (TID) | INTRAVENOUS | Status: DC
  Administered 2021-12-25 – 2021-12-27 (×5): 3.375 g via INTRAVENOUS
  Filled 2021-12-25 (×5): qty 50

## 2021-12-25 MED ORDER — ACETAMINOPHEN 650 MG RE SUPP: 650.0000 mg | Freq: Four times a day (QID) | RECTAL | Status: DC | PRN

## 2021-12-25 MED ORDER — POTASSIUM CHLORIDE IN NACL 20-0.9 MEQ/L-% IV SOLN
INTRAVENOUS | Status: AC
  Filled 2021-12-25: qty 1000

## 2021-12-25 NOTE — Progress Notes (Signed)
Patient ID: Debbie Ball, female   DOB: [birth year], 57 y.o.   MRN: [id number] This is AN no charge note as patient was admitted this AM.  Patient seen and examined H&P reviewed Debbie Ball is a pleasant 56 y.o. female with medical history significant for hypertension, GERD, and diverticulitis and June 2020, now presenting to the emergency department with left lower quadrant abdominal pain, chills, and nausea.  Symptoms began 2 days ago and are similar to her prior episode of diverticulitis.1. Acute sigmoid colon diverticulitis with micro perforation. No evidence for abscess.  Still with some pain. No n/v  Reg, s1/s2  Cta no w/r Soft mild ttp general  No edema  A/P: Continue iv zosyn Continue fluids Continue clear diet for today Replace electrolytes as he is 2.6

## 2021-12-25 NOTE — H&P (Signed)
History and Physical    Debbie Ball GGY:[id number] DOB: 1966/08/15 DOA: 12/24/2021  PCP: Pcp, No   Patient coming from: Home   Chief Complaint: Abdominal pain   HPI: Debbie Ball is a pleasant 56 y.o. female with medical history significant for hypertension, GERD, and diverticulitis and June 2020, now presenting to the emergency department with left lower quadrant abdominal pain, chills, and nausea.  Symptoms began 2 days ago and are similar to her prior episode of diverticulitis.  She denies rectal bleeding, chest pain, shortness of breath, or cough.   ED Course: Upon arrival to the ED, patient is found to be afebrile, saturating well on room air, and with stable blood pressure.  Blood work notable for potassium 3.0, WBC 14,600, and mild thrombocytopenia.  Patient was given Zosyn, morphine, Zofran, and potassium in the ED.  Review of Systems:  All other systems reviewed and apart from HPI, are negative.  Past Medical History:  Diagnosis Date   GERD (gastroesophageal reflux disease)    Hypertension    Tobacco abuse     Past Surgical History:  Procedure Laterality Date   INDUCED ABORTION      Social History:   reports that she has been smoking. She has a 7.50 pack-year smoking history. She has never used smokeless tobacco. She reports current alcohol use. She reports that she does not use drugs.  No Known Allergies  Family History  Problem Relation Age of Onset   Hypertension Father    Hypertension Sister    Other Neg Hx      Prior to Admission medications   Medication Sig Start Date End Date Taking? Authorizing Provider  acetaminophen (TYLENOL) 500 MG tablet Take 500 mg by mouth every 6 (six) hours as needed for mild pain.   Yes [provider]  atenolol (TENORMIN) 25 MG tablet Take 1 tablet (25 mg total) by mouth daily. 11/03/21 02/01/22 Yes Theadora Rama Scales, PA-C  cetirizine (ZYRTEC ALLERGY) 10 MG tablet Take 1 tablet (10 mg total) by mouth at bedtime.  11/03/21 02/01/22 Yes Theadora Rama Scales, PA-C  fluticasone (FLONASE) 50 MCG/ACT nasal spray Place 2 sprays into both nostrils daily. 11/03/21 02/01/22 Yes Theadora Rama Scales, PA-C  hydrochlorothiazide (HYDRODIURIL) 25 MG tablet Take 1 tablet (25 mg total) by mouth daily. 11/03/21 02/01/22 Yes Theadora Rama Scales, PA-C  ipratropium (ATROVENT) 0.06 % nasal spray Place 2 sprays into both nostrils 4 (four) times daily. 11/03/21 12/25/21 Yes Theadora Rama Scales, PA-C  Multiple Vitamins-Minerals (MULTIVITAMIN WITH MINERALS) tablet Take 1 tablet by mouth daily.   Yes [provider]  nicotine (NICODERM CQ - DOSED IN MG/24 HOURS) 14 mg/24hr patch Place 1 patch (14 mg total) onto the skin daily. Patient not taking: Reported on 12/25/2021 06/07/19   Calvert Cantor, MD    Physical Exam: Vitals:   12/24/21 1550 12/24/21 1905 12/24/21 2306 12/25/21 0246  BP: 128/70 (!) 147/86 133/81 123/77  Pulse: 81 83 88 92  Resp: 16 16 16 12   Temp: 99.6 F (37.6 C)   98.9 F (37.2 C)  TempSrc: Oral     SpO2: 100% 98% 98% 96%    Constitutional: NAD, calm  Eyes: PERTLA, lids and conjunctivae normal ENMT: Mucous membranes are moist. Posterior pharynx clear of any exudate or lesions.   Neck: supple, no masses  Respiratory: no wheezing, no crackles. No accessory muscle use.  Cardiovascular: S1 & S2 heard, regular rate and rhythm. No extremity edema.   Abdomen: No distension, soft, tender  in LLQ without rebound pain or guarding. Bowel sounds active.  Musculoskeletal: no clubbing / cyanosis. No joint deformity upper and lower extremities.   Skin: no significant rashes, lesions, ulcers. Warm, dry, well-perfused. Neurologic: CN 2-12 grossly intact. Moving all extremities. Alert and oriented.  Psychiatric: Pleasant. Cooperative.    Labs and Imaging on Admission: I have personally reviewed following labs and imaging studies  CBC: Recent Labs  Lab 12/24/21 1608  WBC 14.6*  HGB 13.4  HCT 39.6  MCV  97.3  PLT 138*   Basic Metabolic Panel: Recent Labs  Lab 12/24/21 1608  NA 139  K 3.0*  CL 101  CO2 27  GLUCOSE 131*  BUN 18  CREATININE 0.74  CALCIUM 9.2   GFR: CrCl cannot be calculated (Unknown ideal weight.). Liver Function Tests: Recent Labs  Lab 12/24/21 1608  AST 21  ALT 15  ALKPHOS 74  BILITOT 1.1  PROT 7.1  ALBUMIN 3.8   Recent Labs  Lab 12/24/21 1608  LIPASE 26   No results for input(s): AMMONIA in the last 168 hours. Coagulation Profile: No results for input(s): INR, PROTIME in the last 168 hours. Cardiac Enzymes: No results for input(s): CKTOTAL, CKMB, CKMBINDEX, TROPONINI in the last 168 hours. BNP (last 3 results) No results for input(s): PROBNP in the last 8760 hours. HbA1C: No results for input(s): HGBA1C in the last 72 hours. CBG: No results for input(s): GLUCAP in the last 168 hours. Lipid Profile: No results for input(s): CHOL, HDL, LDLCALC, TRIG, CHOLHDL, LDLDIRECT in the last 72 hours. Thyroid Function Tests: No results for input(s): TSH, T4TOTAL, FREET4, T3FREE, THYROIDAB in the last 72 hours. Anemia Panel: No results for input(s): VITAMINB12, FOLATE, FERRITIN, TIBC, IRON, RETICCTPCT in the last 72 hours. Urine analysis:    Component Value Date/Time   COLORURINE YELLOW 06/05/2019 0041   APPEARANCEUR CLEAR 06/05/2019 0041   LABSPEC 1.017 06/05/2019 0041   PHURINE 6.0 06/05/2019 0041   GLUCOSEU NEGATIVE 06/05/2019 0041   HGBUR SMALL (A) 06/05/2019 0041   BILIRUBINUR NEGATIVE 06/05/2019 0041   KETONESUR 5 (A) 06/05/2019 0041   PROTEINUR 30 (A) 06/05/2019 0041   NITRITE NEGATIVE 06/05/2019 0041   LEUKOCYTESUR NEGATIVE 06/05/2019 0041   Sepsis Labs: @LABRCNTIP (procalcitonin:4,lacticidven:4) ) Recent Results (from the past 240 hour(s))  Resp Panel by RT-PCR (Flu A&B, Covid) Nasopharyngeal Swab     Status: None   Collection Time: 12/25/21  3:13 AM   Specimen: Nasopharyngeal Swab; Nasopharyngeal(NP) swabs in vial transport medium   Result Value Ref Range Status   SARS Coronavirus 2 by RT PCR NEGATIVE NEGATIVE Final    Comment: (NOTE) SARS-CoV-2 target nucleic acids are NOT DETECTED.  The SARS-CoV-2 RNA is generally detectable in upper respiratory specimens during the acute phase of infection. The lowest concentration of SARS-CoV-2 viral copies this assay can detect is 138 copies/mL. A negative result does not preclude SARS-Cov-2 infection and should not be used as the sole basis for treatment or other patient management decisions. A negative result may occur with  improper specimen collection/handling, submission of specimen other than nasopharyngeal swab, presence of viral mutation(s) within the areas targeted by this assay, and inadequate number of viral copies(<138 copies/mL). A negative result must be combined with clinical observations, patient history, and epidemiological information. The expected result is Negative.  Fact Sheet for Patients:  BloggerCourse.com  Fact Sheet for Healthcare Providers:  SeriousBroker.it  This test is no t yet approved or cleared by the [place] and  has been authorized for  detection and/or diagnosis of SARS-CoV-2 by FDA under an Emergency Use Authorization (EUA). This EUA will remain  in effect (meaning this test can be used) for the duration of the COVID-19 declaration under Section 564(b)(1) of the Act, 21 U.S.C.section 360bbb-3(b)(1), unless the authorization is terminated  or revoked sooner.       Influenza A by PCR NEGATIVE NEGATIVE Final   Influenza B by PCR NEGATIVE NEGATIVE Final    Comment: (NOTE) The Xpert Xpress SARS-CoV-2/FLU/RSV plus assay is intended as an aid in the diagnosis of influenza from Nasopharyngeal swab specimens and should not be used as a sole basis for treatment. Nasal washings and aspirates are unacceptable for Xpert Xpress SARS-CoV-2/FLU/RSV testing.  Fact Sheet for  Patients: BloggerCourse.com  Fact Sheet for Healthcare Providers: SeriousBroker.it  This test is not yet approved or cleared by the [place] FDA and has been authorized for detection and/or diagnosis of SARS-CoV-2 by FDA under an Emergency Use Authorization (EUA). This EUA will remain in effect (meaning this test can be used) for the duration of the COVID-19 declaration under Section 564(b)(1) of the Act, 21 U.S.C. section 360bbb-3(b)(1), unless the authorization is terminated or revoked.  Performed at [facility], [address], [place], [place] [address]      Radiological Exams on Admission: CT ABDOMEN PELVIS W CONTRAST  Result Date: 12/24/2021 CLINICAL DATA:  Left lower quadrant abdominal pain and rectal pain. EXAM: CT ABDOMEN AND PELVIS WITH CONTRAST TECHNIQUE: Multidetector CT imaging of the abdomen and pelvis was performed using the standard protocol following bolus administration of intravenous contrast. CONTRAST:  OMNIPAQUE IOHEXOL 300 MG/ML  SOLN COMPARISON:  CT abdomen and pelvis 06/05/2019. FINDINGS: Lower chest: No acute abnormality. Hepatobiliary: No focal liver abnormality is seen. No gallstones, gallbladder wall thickening, there are 2 subcentimeter hypodensities in the right lobe of the liver favored as small cysts or hemangiomas. Otherwise, the liver, gallbladder and bile ducts are within normal limits. Pancreas: Unremarkable. No pancreatic ductal dilatation or surrounding inflammatory changes. Spleen: Normal in size without focal abnormality. Adrenals/Urinary Tract: Adrenal glands are unremarkable. Kidneys are normal, without renal calculi, focal lesion, or hydronephrosis. Bladder is unremarkable. Stomach/Bowel: The appendix is mildly dilated measuring 8 mm. There is no surrounding inflammation. There is no evidence for bowel obstruction or free air. There is sigmoid colon diverticulosis with mid sigmoid colon  wall thickening and surrounding inflammatory stranding compatible with acute diverticulitis. There is a small amount of free air in the adjacent pelvis compatible with micro perforation. No definite drainable fluid collection. Vascular/Lymphatic: No significant vascular findings are present. No enlarged abdominal or pelvic lymph nodes. Reproductive: Uterus and bilateral adnexa are unremarkable. Other: There is a small amount of free fluid and inflammatory stranding in the pelvis. There is no focal abdominal wall hernia. Musculoskeletal: No acute or significant osseous findings. IMPRESSION: 1. Acute sigmoid colon diverticulitis with micro perforation. No evidence for abscess. 2. Small amount of free fluid in the pelvis. 3. Mildly dilated appendix measuring 8 mm. No surrounding inflammation. Appearance is similar to the prior examination and is likely within normal limits for this patient. Please correlate clinically. Electronically Signed   By: Darliss Cheney M.D.   On: 12/24/2021 18:37    Assessment/Plan   1. Acute diverticulitis  - Presents with 2 days LLQ pain and chills and found to have acute sigmoid diverticulitis on CT with microperforation, no evidence for abscess  - She was started on Zosyn in ED  - Plan for continueed  IV abx, IVF hydration, pain-control, bowel rest, and repeat imaging if she deteriorates or fails to improve in 2-3 days    2. Hypokalemia  - Serum potassium 3.0 in ED  - Supplement potassium, repeat chemistries    3. Hypertension  - BP at goal, treat as-needed only for now    DVT prophylaxis: Lovenox  Code Status: Full  Level of Care: Level of care: Med-Surg Family Communication: None present  Disposition Plan:  Patient is from: home  Anticipated d/c is to: home  Anticipated d/c date is: 12/28/21 Patient currently: Pending pain-control, tolerance of adequate oral intake, decreased WBC   Consults called: none  Admission status: Inpatient     Briscoe Deutscher, MD [facility]  12/25/2021, 4:42 AM

## 2021-12-25 NOTE — ED Notes (Signed)
Tylenol PRN given for temp of 100.6. Will continue to monitor.

## 2021-12-25 NOTE — Consult Note (Signed)
Surgical Evaluation Requesting provider: Nolberto Hanlon MD  Chief Complaint: Abdominal pain  HPI: Pleasant 56 year old woman with medical history listed below who presents with her second episode of diverticulitis.  Her first was in June 2020 and was per the patient symptomatically worse than this episode.  She had a colonoscopy before the previous episode of diverticulitis which by the patient report was unremarkable.  She developed left lower quadrant abdominal pain, chills and nausea approximately 2 days ago.  Denies any hematochezia, melena, constipation, diarrhea, or vomiting.  Imaging confirms diverticulitis with microperforation, no abscess.  Today she reports that her pain is significantly better.  Her main frustration is that she has been in the hallway in the emergency room essentially all night and all day today.  No Known Allergies  Past Medical History:  Diagnosis Date   GERD (gastroesophageal reflux disease)    Hypertension    Tobacco abuse     Past Surgical History:  Procedure Laterality Date   INDUCED ABORTION      Family History  Problem Relation Age of Onset   Hypertension Father    Hypertension Sister    Other Neg Hx     Social History   Socioeconomic History   Marital status: Single    Spouse name: Not on file   Number of children: Not on file   Years of education: Not on file   Highest education level: Not on file  Occupational History   Not on file  Tobacco Use   Smoking status: Every Day    Packs/day: 0.50    Years: 15.00    Pack years: 7.50    Types: Cigarettes   Smokeless tobacco: Never  Substance and Sexual Activity   Alcohol use: Yes    Comment: Socially    Drug use: No   Sexual activity: Yes  Other Topics Concern   Not on file  Social History Narrative   Not on file   Social Determinants of Health   Financial Resource Strain: Not on file  Food Insecurity: Not on file  Transportation Needs: Not on file  Physical Activity: Not on  file  Stress: Not on file  Social Connections: Not on file    No current facility-administered medications on file prior to encounter.   Current Outpatient Medications on File Prior to Encounter  Medication Sig Dispense Refill   acetaminophen (TYLENOL) 500 MG tablet Take 500 mg by mouth every 6 (six) hours as needed for mild pain.     atenolol (TENORMIN) 25 MG tablet Take 1 tablet (25 mg total) by mouth daily. 30 tablet 2   cetirizine (ZYRTEC ALLERGY) 10 MG tablet Take 1 tablet (10 mg total) by mouth at bedtime. 30 tablet 2   fluticasone (FLONASE) 50 MCG/ACT nasal spray Place 2 sprays into both nostrils daily. 18 mL 2   hydrochlorothiazide (HYDRODIURIL) 25 MG tablet Take 1 tablet (25 mg total) by mouth daily. 30 tablet 2   ipratropium (ATROVENT) 0.06 % nasal spray Place 2 sprays into both nostrils 4 (four) times daily. 15 mL 2   Multiple Vitamins-Minerals (MULTIVITAMIN WITH MINERALS) tablet Take 1 tablet by mouth daily.     nicotine (NICODERM CQ - DOSED IN MG/24 HOURS) 14 mg/24hr patch Place 1 patch (14 mg total) onto the skin daily. (Patient not taking: Reported on 12/25/2021) 14 patch 0    Review of Systems: a complete, 10pt review of systems was completed with pertinent positives and negatives as documented in the HPI  Physical Exam:  Vitals:   12/25/21 1012 12/25/21 1445  BP: 108/68 124/76  Pulse: 82 84  Resp: 16 17  Temp: 98.7 F (37.1 C) (!) 100.6 F (38.1 C)  SpO2: 100% 100%   Alert and well-appearing Extraocular motions intact, no scleral icterus Unlabored respirations Abdomen is soft, nondistended, minimally tender in the left lower quadrant without guarding or peritoneal signs No lower extremity edema   CBC Latest Ref Rng & Units 12/25/2021 12/24/2021 10/04/2021  WBC 4.0 - 10.5 K/uL 15.1(H) 14.6(H) 9.7  Hemoglobin 12.0 - 15.0 g/dL 12.7 13.4 13.7  Hematocrit 36.0 - 46.0 % 36.2 39.6 42.1  Platelets 150 - 400 K/uL 130(L) 138(L) 161    CMP Latest Ref Rng & Units 12/25/2021  12/24/2021 10/04/2021  Glucose 70 - 99 mg/dL 114(H) 131(H) 91  BUN 6 - 20 mg/dL 15 18 15   Creatinine 0.44 - 1.00 mg/dL 0.86 0.74 0.74  Sodium 135 - 145 mmol/L 137 139 138  Potassium 3.5 - 5.1 mmol/L 2.6(LL) 3.0(L) 3.1(L)  Chloride 98 - 111 mmol/L 98 101 100  CO2 22 - 32 mmol/L 29 27 27   Calcium 8.9 - 10.3 mg/dL 8.6(L) 9.2 9.8  Total Protein 6.5 - 8.1 g/dL - 7.1 -  Total Bilirubin 0.3 - 1.2 mg/dL - 1.1 -  Alkaline Phos 38 - 126 U/L - 74 -  AST 15 - 41 U/L - 21 -  ALT 0 - 44 U/L - 15 -    Lab Results  Component Value Date   INR 1.3 (H) 06/05/2019    Imaging: CT ABDOMEN PELVIS W CONTRAST  Result Date: 12/24/2021 CLINICAL DATA:  Left lower quadrant abdominal pain and rectal pain. EXAM: CT ABDOMEN AND PELVIS WITH CONTRAST TECHNIQUE: Multidetector CT imaging of the abdomen and pelvis was performed using the standard protocol following bolus administration of intravenous contrast. CONTRAST:  150mL OMNIPAQUE IOHEXOL 300 MG/ML  SOLN COMPARISON:  CT abdomen and pelvis 06/05/2019. FINDINGS: Lower chest: No acute abnormality. Hepatobiliary: No focal liver abnormality is seen. No gallstones, gallbladder wall thickening, there are 2 subcentimeter hypodensities in the right lobe of the liver favored as small cysts or hemangiomas. Otherwise, the liver, gallbladder and bile ducts are within normal limits. Pancreas: Unremarkable. No pancreatic ductal dilatation or surrounding inflammatory changes. Spleen: Normal in size without focal abnormality. Adrenals/Urinary Tract: Adrenal glands are unremarkable. Kidneys are normal, without renal calculi, focal lesion, or hydronephrosis. Bladder is unremarkable. Stomach/Bowel: The appendix is mildly dilated measuring 8 mm. There is no surrounding inflammation. There is no evidence for bowel obstruction or free air. There is sigmoid colon diverticulosis with mid sigmoid colon wall thickening and surrounding inflammatory stranding compatible with acute diverticulitis. There  is a small amount of free air in the adjacent pelvis compatible with micro perforation. No definite drainable fluid collection. Vascular/Lymphatic: No significant vascular findings are present. No enlarged abdominal or pelvic lymph nodes. Reproductive: Uterus and bilateral adnexa are unremarkable. Other: There is a small amount of free fluid and inflammatory stranding in the pelvis. There is no focal abdominal wall hernia. Musculoskeletal: No acute or significant osseous findings. IMPRESSION: 1. Acute sigmoid colon diverticulitis with micro perforation. No evidence for abscess. 2. Small amount of free fluid in the pelvis. 3. Mildly dilated appendix measuring 8 mm. No surrounding inflammation. Appearance is similar to the prior examination and is likely within normal limits for this patient. Please correlate clinically. Electronically Signed   By: Ronney Asters M.D.   On: 12/24/2021 18:37     A/P: Diverticulitis with  microperforation.  Hypokalemia, hypomagnesemia. Agree with current treatment plan of fluid resuscitation, bowel rest and IV antibiotics.  She is currently on a clear liquid diet, will remain on this for now.  If pain continues to improve tomorrow we will advance diet and consider transitioning to oral antibiotics.  Replete electrolytes with goal potassium of 4 and goal magnesium of 2 (already ordered this morning by primary team).    Patient Active Problem List   Diagnosis Date Noted   Acute diverticulitis 12/25/2021   Sigmoid diverticulitis 06/05/2019   Hypertension    GERD (gastroesophageal reflux disease)    Tobacco abuse    Hypokalemia        Romana Juniper, MD [facility] [place]  See AMION to contact appropriate on-call provider

## 2021-12-25 NOTE — Progress Notes (Signed)
Pharmacy Antibiotic Note  Debbie Ball is a 56 y.o. female admitted on 12/24/2021 with  diverticulitis .  Pharmacy has been consulted for Zosyn dosing. WBC mildly elevated. Renal function good.   Plan: Zosyn 3.375G IV q8h to be infused over 4 hours Trend WBC, temp, renal function  F/U infectious work-up  Temp (24hrs), Avg:99.3 F (37.4 C), Min:98.9 F (37.2 C), Max:99.6 F (37.6 C)  Recent Labs  Lab 12/24/21 1608  WBC 14.6*  CREATININE 0.74    CrCl cannot be calculated (Unknown ideal weight.).    No Known Allergies  Abran Duke, PharmD, BCPS Clinical Pharmacist Phone: [phone]

## 2021-12-25 NOTE — ED Provider Notes (Signed)
[facility] EMERGENCY DEPARTMENT Provider Note   CSN: [id number] Arrival date & time: 12/24/21  1532     History  Chief Complaint  Patient presents with   Abdominal Pain    Debbie Ball is a 56 y.o. female.  HPI     This is a 56 year old female with a story of hypertension, reflux, diverticulitis who presents with abdominal pain.  Patient reports 3-day history of worsening diffuse abdominal pain and cramping.  She states it feels similar to when she had diverticulitis several years ago.  She has had some nausea and vomiting.  This has been nonbilious and nonbloody.  She states that she also has some pain in her "backside" and describes it as "like I need to have a bowel movement."  Denies fevers.  Rates pain right now at 5 out of 10.  It was worse on Sunday night.  Home Medications Prior to Admission medications   Medication Sig Start Date End Date Taking? Authorizing Provider  acetaminophen (TYLENOL) 500 MG tablet Take 500 mg by mouth every 6 (six) hours as needed for mild pain.   Yes [provider]  atenolol (TENORMIN) 25 MG tablet Take 1 tablet (25 mg total) by mouth daily. 11/03/21 02/01/22 Yes Lynden Oxford Scales, PA-C  cetirizine (ZYRTEC ALLERGY) 10 MG tablet Take 1 tablet (10 mg total) by mouth at bedtime. 11/03/21 02/01/22 Yes Lynden Oxford Scales, PA-C  fluticasone (FLONASE) 50 MCG/ACT nasal spray Place 2 sprays into both nostrils daily. 11/03/21 02/01/22 Yes Lynden Oxford Scales, PA-C  hydrochlorothiazide (HYDRODIURIL) 25 MG tablet Take 1 tablet (25 mg total) by mouth daily. 11/03/21 02/01/22 Yes Lynden Oxford Scales, PA-C  ipratropium (ATROVENT) 0.06 % nasal spray Place 2 sprays into both nostrils 4 (four) times daily. 11/03/21 12/25/21 Yes Lynden Oxford Scales, PA-C  Multiple Vitamins-Minerals (MULTIVITAMIN WITH MINERALS) tablet Take 1 tablet by mouth daily.   Yes [provider]  nicotine (NICODERM CQ - DOSED IN MG/24 HOURS) 14  mg/24hr patch Place 1 patch (14 mg total) onto the skin daily. Patient not taking: Reported on 12/25/2021 06/07/19   Debbe Odea, MD      Allergies    Patient has no known allergies.    Review of Systems   Review of Systems  Constitutional:  Negative for fever.  Respiratory:  Negative for shortness of breath.   Cardiovascular:  Negative for chest pain.  Gastrointestinal:  Positive for abdominal pain, nausea and vomiting. Negative for blood in stool, constipation and diarrhea.  Genitourinary:  Negative for dysuria.  All other systems reviewed and are negative.  Physical Exam Updated Vital Signs BP 123/77 (BP Location: Left Arm)    Pulse 92    Temp 98.9 F (37.2 C)    Resp 12    SpO2 96%  Physical Exam Vitals and nursing note reviewed.  Constitutional:      Appearance: She is well-developed. She is obese. She is not ill-appearing.  HENT:     Head: Normocephalic and atraumatic.  Eyes:     Pupils: Pupils are equal, round, and reactive to light.  Cardiovascular:     Rate and Rhythm: Normal rate and regular rhythm.     Heart sounds: Normal heart sounds.  Pulmonary:     Effort: Pulmonary effort is normal. No respiratory distress.     Breath sounds: No wheezing.  Abdominal:     General: Bowel sounds are normal.     Palpations: Abdomen is soft.     Tenderness: There  is generalized abdominal tenderness and tenderness in the left lower quadrant. There is no guarding or rebound.  Musculoskeletal:     Cervical back: Neck supple.  Skin:    General: Skin is warm and dry.  Neurological:     Mental Status: She is alert and oriented to person, place, and time.  Psychiatric:        Mood and Affect: Mood normal.    ED Results / Procedures / Treatments   Labs (all labs ordered are listed, but only abnormal results are displayed) Labs Reviewed  COMPREHENSIVE METABOLIC PANEL - Abnormal; Notable for the following components:      Result Value   Potassium 3.0 (*)    Glucose, Bld 131 (*)     All other components within normal limits  CBC - Abnormal; Notable for the following components:   WBC 14.6 (*)    Platelets 138 (*)    All other components within normal limits  RESP PANEL BY RT-PCR (FLU A&B, COVID) ARPGX2  LIPASE, BLOOD  URINALYSIS, ROUTINE W REFLEX MICROSCOPIC  I-STAT BETA HCG BLOOD, ED ([facility], [facility], [facility] ONLY)    EKG None  Radiology CT ABDOMEN PELVIS W CONTRAST  Result Date: 12/24/2021 CLINICAL DATA:  Left lower quadrant abdominal pain and rectal pain. EXAM: CT ABDOMEN AND PELVIS WITH CONTRAST TECHNIQUE: Multidetector CT imaging of the abdomen and pelvis was performed using the standard protocol following bolus administration of intravenous contrast. CONTRAST:  155mL OMNIPAQUE IOHEXOL 300 MG/ML  SOLN COMPARISON:  CT abdomen and pelvis 06/05/2019. FINDINGS: Lower chest: No acute abnormality. Hepatobiliary: No focal liver abnormality is seen. No gallstones, gallbladder wall thickening, there are 2 subcentimeter hypodensities in the right lobe of the liver favored as small cysts or hemangiomas. Otherwise, the liver, gallbladder and bile ducts are within normal limits. Pancreas: Unremarkable. No pancreatic ductal dilatation or surrounding inflammatory changes. Spleen: Normal in size without focal abnormality. Adrenals/Urinary Tract: Adrenal glands are unremarkable. Kidneys are normal, without renal calculi, focal lesion, or hydronephrosis. Bladder is unremarkable. Stomach/Bowel: The appendix is mildly dilated measuring 8 mm. There is no surrounding inflammation. There is no evidence for bowel obstruction or free air. There is sigmoid colon diverticulosis with mid sigmoid colon wall thickening and surrounding inflammatory stranding compatible with acute diverticulitis. There is a small amount of free air in the adjacent pelvis compatible with micro perforation. No definite drainable fluid collection. Vascular/Lymphatic: No significant vascular findings are present. No enlarged abdominal  or pelvic lymph nodes. Reproductive: Uterus and bilateral adnexa are unremarkable. Other: There is a small amount of free fluid and inflammatory stranding in the pelvis. There is no focal abdominal wall hernia. Musculoskeletal: No acute or significant osseous findings. IMPRESSION: 1. Acute sigmoid colon diverticulitis with micro perforation. No evidence for abscess. 2. Small amount of free fluid in the pelvis. 3. Mildly dilated appendix measuring 8 mm. No surrounding inflammation. Appearance is similar to the prior examination and is likely within normal limits for this patient. Please correlate clinically. Electronically Signed   By: Ronney Asters M.D.   On: 12/24/2021 18:37    Procedures Procedures    Medications Ordered in ED Medications  morphine 4 MG/ML injection 4 mg (has no administration in time range)  ondansetron (ZOFRAN) injection 4 mg (has no administration in time range)  potassium chloride 10 mEq in 100 mL IVPB (has no administration in time range)  iohexol (OMNIPAQUE) 300 MG/ML solution 100 mL (100 mLs Intravenous Contrast Given 12/24/21 1806)  piperacillin-tazobactam (ZOSYN) IVPB 3.375 g (  0 g Intravenous Stopped 12/25/21 [id number])    ED Course/ Medical Decision Making/ A&P Clinical Course as of 12/25/21 [place] Dec 25, 2021  0313 Spoke with Dr. Kieth Brightly.  Agrees with IV antibiotics.  May consult general surgery during daytime if needed. [CH]    Clinical Course User Index [CH] Oluwaferanmi Wain, Barbette Hair, MD                           Medical Decision Making  This patient presents to the ED for concern of abdominal pain, this involves an extensive number of treatment options, and is a complaint that carries with it a high risk of complications and morbidity.  The differential diagnosis includes divertisulitis, colitis, SBO   Co morbidities that complicate the patient evaluation HTN   Additional history obtained from chart, patient External records from outside source obtained and  reviewed including prior ED visits   Labs: I Ordered, and personally interpreted labs.  The pertinent results include:  leukocytosis and hypokalemia to 3.0   Imaging Studies ordered: I ordered imaging studies including CT I independently visualized and interpreted imaging which showed diverticulitis with microperforation.  No abscess I agree with the radiologist interpretation   Cardiac Monitoring: The patient was maintained on a cardiac monitor.  I personally viewed and interpreted the cardiac monitored which showed an underlying rhythm of: n/a   Medicines  I ordered medication including zosyn, zofran, morphine  for infection and symptom management. I have reviewed the patients home medicines and have made adjustments as needed   Critical Interventions: Antibiotics   Consultations Obtained: I requested consultation with the hospitalist,  and discussed lab and imaging findings as well as pertinent plan - they recommend: admission   Problem List / ED Course: Diverticulitis w/ microperforation   Reevaluation: After the interventions noted above, I reevaluated the patient and found that they have :stayed the same   Social Determinants of Health: n/a   Dispostion: After consideration of the diagnostic results and the patients response to treatment, I feel that the patent would benefit from admission and IV antibiotics.         Final Clinical Impression(s) / ED Diagnoses Final diagnoses:  Diverticulitis of colon with perforation    Rx / DC Orders ED Discharge Orders     None         Merryl Hacker, MD 12/25/21 [phone]

## 2021-12-26 LAB — CBC
HCT: 34.7 % — ABNORMAL LOW (ref 36.0–46.0)
Hemoglobin: 11.7 g/dL — ABNORMAL LOW (ref 12.0–15.0)
MCH: 32.6 pg (ref 26.0–34.0)
MCHC: 33.7 g/dL (ref 30.0–36.0)
MCV: 96.7 fL (ref 80.0–100.0)
Platelets: 115 10*3/uL — ABNORMAL LOW (ref 150–400)
RBC: 3.59 MIL/uL — ABNORMAL LOW (ref 3.87–5.11)
RDW: 14.6 % (ref 11.5–15.5)
WBC: 13.5 10*3/uL — ABNORMAL HIGH (ref 4.0–10.5)
nRBC: 0 % (ref 0.0–0.2)

## 2021-12-26 LAB — BASIC METABOLIC PANEL
Anion gap: 8 (ref 5–15)
BUN: 11 mg/dL (ref 6–20)
CO2: 26 mmol/L (ref 22–32)
Calcium: 8.3 mg/dL — ABNORMAL LOW (ref 8.9–10.3)
Chloride: 100 mmol/L (ref 98–111)
Creatinine, Ser: 0.79 mg/dL (ref 0.44–1.00)
GFR, Estimated: >60 mL/min (ref >60)
Glucose, Bld: 90 mg/dL (ref 70–99)
Potassium: 2.6 mmol/L — CL (ref 3.5–5.1)
Sodium: 134 mmol/L — ABNORMAL LOW (ref 135–145)

## 2021-12-26 MED ORDER — POTASSIUM CHLORIDE 10 MEQ/100ML IV SOLN
INTRAVENOUS | Status: AC
  Administered 2021-12-26: 10 meq
  Filled 2021-12-26: qty 600

## 2021-12-26 MED ORDER — POLYETHYLENE GLYCOL 3350 17 G PO PACK
17.0000 g | PACK | Freq: Every day | ORAL | Status: DC
  Administered 2021-12-26 – 2021-12-27 (×2): 17 g via ORAL
  Filled 2021-12-26 (×2): qty 1

## 2021-12-26 MED ORDER — BOOST / RESOURCE BREEZE PO LIQD CUSTOM
1.0000 | Freq: Three times a day (TID) | ORAL | Status: DC
  Administered 2021-12-26 (×3): 1 via ORAL

## 2021-12-26 MED ORDER — POTASSIUM CHLORIDE 10 MEQ/100ML IV SOLN
10.0000 meq | INTRAVENOUS | Status: AC
  Administered 2021-12-26 (×5): 10 meq via INTRAVENOUS

## 2021-12-26 MED ORDER — POTASSIUM CHLORIDE CRYS ER 20 MEQ PO TBCR
40.0000 meq | EXTENDED_RELEASE_TABLET | Freq: Once | ORAL | Status: AC
  Administered 2021-12-26: 40 meq via ORAL
  Filled 2021-12-26: qty 2

## 2021-12-26 NOTE — Progress Notes (Signed)
Patient ID: Debbie Ball, female   DOB: 1966-06-13, 56 y.o.   MRN: [id number] [facility] Surgery Progress Note     Subjective: CC-  Continues to feel better. Patient reports minimal LLQ pain. Tolerating clear liquids. Denies n/v. Passing flatus, no BM. WBC down 13.5, TMAX 100.9  Objective: Vital signs in last 24 hours: Temp:  [98.7 F (37.1 C)-100.9 F (38.3 C)] 99.1 F (37.3 C) (01/04 0754) Pulse Rate:  [81-100] 100 (01/04 0754) Resp:  [15-18] 17 (01/04 0754) BP: (108-129)/(62-83) 129/83 (01/04 0754) SpO2:  [99 %-100 %] 100 % (01/04 0754)    Intake/Output from previous day: 01/03 0701 - 01/04 0700 In: 573.9 [IV Piggyback:573.9] Out: -  Intake/Output this shift: No intake/output data recorded.  PE: Gen:  Alert, NAD, pleasant Pulm:  rate and effort normal Abd: Soft, ND, minimal LLQ TTP without rebound or guarding  Lab Results:  Recent Labs    12/25/21 0605 12/26/21 0201  WBC 15.1* 13.5*  HGB 12.7 11.7*  HCT 36.2 34.7*  PLT 130* 115*   BMET Recent Labs    12/25/21 0605 12/26/21 0201  NA 137 134*  K 2.6* 2.6*  CL 98 100  CO2 29 26  GLUCOSE 114* 90  BUN 15 11  CREATININE 0.86 0.79  CALCIUM 8.6* 8.3*   PT/INR No results for input(s): LABPROT, INR in the last 72 hours. CMP     Component Value Date/Time   NA 134 (L) 12/26/2021 0201   K 2.6 (LL) 12/26/2021 0201   CL 100 12/26/2021 0201   CO2 26 12/26/2021 0201   GLUCOSE 90 12/26/2021 0201   BUN 11 12/26/2021 0201   CREATININE 0.79 12/26/2021 0201   CALCIUM 8.3 (L) 12/26/2021 0201   PROT 7.1 12/24/2021 1608   ALBUMIN 3.8 12/24/2021 1608   AST 21 12/24/2021 1608   ALT 15 12/24/2021 1608   ALKPHOS 74 12/24/2021 1608   BILITOT 1.1 12/24/2021 1608   GFRNONAA >60 12/26/2021 0201   GFRAA >60 06/07/2019 0309   Lipase     Component Value Date/Time   LIPASE 26 12/24/2021 1608       Studies/Results: CT ABDOMEN PELVIS W CONTRAST  Result Date: 12/24/2021 CLINICAL DATA:  Left lower quadrant  abdominal pain and rectal pain. EXAM: CT ABDOMEN AND PELVIS WITH CONTRAST TECHNIQUE: Multidetector CT imaging of the abdomen and pelvis was performed using the standard protocol following bolus administration of intravenous contrast. CONTRAST:  OMNIPAQUE IOHEXOL 300 MG/ML  SOLN COMPARISON:  CT abdomen and pelvis 06/05/2019. FINDINGS: Lower chest: No acute abnormality. Hepatobiliary: No focal liver abnormality is seen. No gallstones, gallbladder wall thickening, there are 2 subcentimeter hypodensities in the right lobe of the liver favored as small cysts or hemangiomas. Otherwise, the liver, gallbladder and bile ducts are within normal limits. Pancreas: Unremarkable. No pancreatic ductal dilatation or surrounding inflammatory changes. Spleen: Normal in size without focal abnormality. Adrenals/Urinary Tract: Adrenal glands are unremarkable. Kidneys are normal, without renal calculi, focal lesion, or hydronephrosis. Bladder is unremarkable. Stomach/Bowel: The appendix is mildly dilated measuring 8 mm. There is no surrounding inflammation. There is no evidence for bowel obstruction or free air. There is sigmoid colon diverticulosis with mid sigmoid colon wall thickening and surrounding inflammatory stranding compatible with acute diverticulitis. There is a small amount of free air in the adjacent pelvis compatible with micro perforation. No definite drainable fluid collection. Vascular/Lymphatic: No significant vascular findings are present. No enlarged abdominal or pelvic lymph nodes. Reproductive: Uterus and bilateral adnexa are unremarkable.  Other: There is a small amount of free fluid and inflammatory stranding in the pelvis. There is no focal abdominal wall hernia. Musculoskeletal: No acute or significant osseous findings. IMPRESSION: 1. Acute sigmoid colon diverticulitis with micro perforation. No evidence for abscess. 2. Small amount of free fluid in the pelvis. 3. Mildly dilated appendix measuring 8 mm. No  surrounding inflammation. Appearance is similar to the prior examination and is likely within normal limits for this patient. Please correlate clinically. Electronically Signed   By: Darliss Cheney M.D.   On: 12/24/2021 18:37    Anti-infectives: Anti-infectives (From admission, onward)    Start     Dose/Rate Route Frequency Ordered Stop   12/25/21 1000  piperacillin-tazobactam (ZOSYN) IVPB 3.375 g        3.375 g 12.5 mL/hr over 240 Minutes Intravenous Every 8 hours 12/25/21 0353     12/25/21 0230  piperacillin-tazobactam (ZOSYN) IVPB 3.375 g        3.375 g 100 mL/hr over 30 Minutes Intravenous  Once 12/25/21 0219 12/25/21 9417        Assessment/Plan  Diverticulitis with microperforation - 2nd bout - CT scan 1/2 showed acute sigmoid colon diverticulitis with micro perforation, no evidence for abscess - WBC is trending down and pain improving - Advance to full liquids. Continue IV zosyn. Repeat labs in AM. Hopefully patient will continue to improve and we can ultimately discharge home on oral antibiotics with GI follow up for colonoscopy in 6-8 weeks.   ID - zosyn 1/3>> FEN - IVF, FLD VTE - lovenox Foley - none  Hypokalemia - repleted by primary team HTN  Straightforward Medical Decision Making   LOS: 1 day    Franne Forts, [facility] 12/26/2021, 10:02 AM Please see Amion for pager number during day hours 7:00am-4:30pm

## 2021-12-26 NOTE — Progress Notes (Signed)
PROGRESS NOTE    Debbie RastKim Y Tozzi  AVW:[id number] DOB: [birth year] DOA: 12/24/2021 PCP: Oneita HurtPcp, No    Brief Narrative:  Debbie Ball is a pleasant 56 y.o. female with medical history significant for hypertension, GERD, and diverticulitis and June 2020, now presenting to the emergency department with left lower quadrant abdominal pain, chills, and nausea.  Symptoms began 2 days ago and are similar to her prior episode of diverticulitis  1/4 tolerated full liquid diet. Discuss increasing fiber intake, avoid constipation, and seeds.    Consultants:  surgery  Procedures: ct  Antimicrobials:  zosyn   Subjective: Less abd pain. No n/v  Objective: Vitals:   12/26/21 0754 12/26/21 1451 12/26/21 1451 12/26/21 1954  BP: 129/83 138/87 138/87 137/79  Pulse: 100 90 90 84  Resp: 17 17 17 18   Temp: 99.1 F (37.3 C) 99.2 F (37.3 C) 99.2 F (37.3 C) 99.3 F (37.4 C)  TempSrc: Oral Oral Oral Oral  SpO2: 100% 100% 100% 100%    Intake/Output Summary (Last 24 hours) at 12/26/2021 2014 Last data filed at 12/26/2021 1700 Gross per 24 hour  Intake 873.88 ml  Output --  Net 873.88 ml   There were no vitals filed for this visit.  Examination:  General exam: Appears calm and comfortable  Respiratory system: Clear to auscultation. Respiratory effort normal. Cardiovascular system: S1 & S2 heard, RRR. No JVD, murmurs, rubs, gallops or clicks.  Gastrointestinal system: Abdomen is nondistended, soft and nontender. . Normal bowel sounds heard. Central nervous system: Alert and oriented. No focal neurological deficits. Extremities: no edema Psychiatry: Judgement and insight appear normal. Mood & affect appropriate.     Data Reviewed: I have personally reviewed following labs and imaging studies  CBC: Recent Labs  Lab 12/24/21 1608 12/25/21 0605 12/26/21 0201  WBC 14.6* 15.1* 13.5*  HGB 13.4 12.7 11.7*  HCT 39.6 36.2 34.7*  MCV 97.3 95.5 96.7  PLT 138* 130* 115*   Basic Metabolic  Panel: Recent Labs  Lab 12/24/21 1608 12/25/21 0605 12/26/21 0201  NA 139 137 134*  K 3.0* 2.6* 2.6*  CL 101 98 100  CO2 27 29 26   GLUCOSE 131* 114* 90  BUN 18 15 11   CREATININE 0.74 0.86 0.79  CALCIUM 9.2 8.6* 8.3*  MG  --  1.6*  --    GFR: CrCl cannot be calculated (Unknown ideal weight.). Liver Function Tests: Recent Labs  Lab 12/24/21 1608  AST 21  ALT 15  ALKPHOS 74  BILITOT 1.1  PROT 7.1  ALBUMIN 3.8   Recent Labs  Lab 12/24/21 1608  LIPASE 26   No results for input(s): AMMONIA in the last 168 hours. Coagulation Profile: No results for input(s): INR, PROTIME in the last 168 hours. Cardiac Enzymes: No results for input(s): CKTOTAL, CKMB, CKMBINDEX, TROPONINI in the last 168 hours. BNP (last 3 results) No results for input(s): PROBNP in the last 8760 hours. HbA1C: No results for input(s): HGBA1C in the last 72 hours. CBG: No results for input(s): GLUCAP in the last 168 hours. Lipid Profile: No results for input(s): CHOL, HDL, LDLCALC, TRIG, CHOLHDL, LDLDIRECT in the last 72 hours. Thyroid Function Tests: No results for input(s): TSH, T4TOTAL, FREET4, T3FREE, THYROIDAB in the last 72 hours. Anemia Panel: No results for input(s): VITAMINB12, FOLATE, FERRITIN, TIBC, IRON, RETICCTPCT in the last 72 hours. Sepsis Labs: No results for input(s): PROCALCITON, LATICACIDVEN in the last 168 hours.  Recent Results (from the past 240 hour(s))  Resp Panel by RT-PCR (Flu  A&B, Covid) Nasopharyngeal Swab     Status: None   Collection Time: 12/25/21  3:13 AM   Specimen: Nasopharyngeal Swab; Nasopharyngeal(NP) swabs in vial transport medium  Result Value Ref Range Status   SARS Coronavirus 2 by RT PCR NEGATIVE NEGATIVE Final    Comment: (NOTE) SARS-CoV-2 target nucleic acids are NOT DETECTED.  The SARS-CoV-2 RNA is generally detectable in upper respiratory specimens during the acute phase of infection. The lowest concentration of SARS-CoV-2 viral copies this assay  can detect is 138 copies/mL. A negative result does not preclude SARS-Cov-2 infection and should not be used as the sole basis for treatment or other patient management decisions. A negative result may occur with  improper specimen collection/handling, submission of specimen other than nasopharyngeal swab, presence of viral mutation(s) within the areas targeted by this assay, and inadequate number of viral copies(<138 copies/mL). A negative result must be combined with clinical observations, patient history, and epidemiological information. The expected result is Negative.  Fact Sheet for Patients:  BloggerCourse.com  Fact Sheet for Healthcare Providers:  SeriousBroker.it  This test is no t yet approved or cleared by the [place] FDA and  has been authorized for detection and/or diagnosis of SARS-CoV-2 by FDA under an Emergency Use Authorization (EUA). This EUA will remain  in effect (meaning this test can be used) for the duration of the COVID-19 declaration under Section 564(b)(1) of the Act, 21 U.S.C.section 360bbb-3(b)(1), unless the authorization is terminated  or revoked sooner.       Influenza A by PCR NEGATIVE NEGATIVE Final   Influenza B by PCR NEGATIVE NEGATIVE Final    Comment: (NOTE) The Xpert Xpress SARS-CoV-2/FLU/RSV plus assay is intended as an aid in the diagnosis of influenza from Nasopharyngeal swab specimens and should not be used as a sole basis for treatment. Nasal washings and aspirates are unacceptable for Xpert Xpress SARS-CoV-2/FLU/RSV testing.  Fact Sheet for Patients: BloggerCourse.com  Fact Sheet for Healthcare Providers: SeriousBroker.it  This test is not yet approved or cleared by the [place] FDA and has been authorized for detection and/or diagnosis of SARS-CoV-2 by FDA under an Emergency Use Authorization (EUA). This EUA will  remain in effect (meaning this test can be used) for the duration of the COVID-19 declaration under Section 564(b)(1) of the Act, 21 U.S.C. section 360bbb-3(b)(1), unless the authorization is terminated or revoked.  Performed at [facility], [address] [place], [place] [address]          Radiology Studies: No results found.      Scheduled Meds:  enoxaparin (LOVENOX) injection  40 mg Subcutaneous Q24H   feeding supplement  1 Container Oral TID BM   polyethylene glycol  17 g Oral Daily   Continuous Infusions:  piperacillin-tazobactam (ZOSYN)  IV 3.375 g (12/26/21 1616)    Assessment & Plan:   Principal Problem:   Acute diverticulitis Active Problems:   Hypertension   Hypokalemia   1. Acute sigmoid diverticulitis with microperforation Sx improving Tolerated liquid diet Will advance as tolerated Surgeries input appreciated Wbc coming down Continue iv zosyn. If tolerates diet and does well will dc in am Will need po abx on dc Will need to f/u with GI in 6 -8 weeks for cscope    2. Hypokalemia  Will replace Ck am lab   3. Hypertension  stable   DVT prophylaxis: lovenox Code Status:full Family Communication: none at bedside Disposition Plan: dc home in am if stable Status is: Inpatient  Remains  inpatient appropriate because: IV treatment            LOS: 1 day   Time spent: 35 min with >50% on coc    Lynn Ito, MD [facility] Pager [phone]  If 7PM-7AM, please contact night-coverage 12/26/2021, 8:14 PM

## 2021-12-27 LAB — CBC
HCT: 35.5 % — ABNORMAL LOW (ref 36.0–46.0)
Hemoglobin: 11.7 g/dL — ABNORMAL LOW (ref 12.0–15.0)
MCH: 32.2 pg (ref 26.0–34.0)
MCHC: 33 g/dL (ref 30.0–36.0)
MCV: 97.8 fL (ref 80.0–100.0)
Platelets: 130 10*3/uL — ABNORMAL LOW (ref 150–400)
RBC: 3.63 MIL/uL — ABNORMAL LOW (ref 3.87–5.11)
RDW: 14.3 % (ref 11.5–15.5)
WBC: 11.3 10*3/uL — ABNORMAL HIGH (ref 4.0–10.5)
nRBC: 0 % (ref 0.0–0.2)

## 2021-12-27 LAB — POTASSIUM: Potassium: 3.6 mmol/L (ref 3.5–5.1)

## 2021-12-27 MED ORDER — AMOXICILLIN-POT CLAVULANATE ER 1000-62.5 MG PO TB12: 2.0000 | ORAL_TABLET | Freq: Two times a day (BID) | ORAL | 0 refills | Status: AC

## 2021-12-27 MED ORDER — HYDROCODONE-ACETAMINOPHEN 5-325 MG PO TABS: 1.0000 | ORAL_TABLET | Freq: Four times a day (QID) | ORAL | 0 refills | Status: AC | PRN

## 2021-12-27 MED ORDER — POLYETHYLENE GLYCOL 3350 17 G PO PACK: 17.0000 g | PACK | Freq: Every day | ORAL | 0 refills | Status: AC

## 2021-12-27 MED ORDER — AMOXICILLIN-POT CLAVULANATE 875-125 MG PO TABS
1.0000 | ORAL_TABLET | Freq: Two times a day (BID) | ORAL | Status: DC
  Administered 2021-12-27: 1 via ORAL
  Filled 2021-12-27: qty 1

## 2021-12-27 MED ORDER — SACCHAROMYCES BOULARDII 250 MG PO CAPS
250.0000 mg | ORAL_CAPSULE | Freq: Two times a day (BID) | ORAL | Status: DC
  Administered 2021-12-27: 250 mg via ORAL
  Filled 2021-12-27: qty 1

## 2021-12-27 NOTE — Plan of Care (Signed)

## 2021-12-27 NOTE — Progress Notes (Signed)
Patient ID: Debbie Ball, female   DOB: Oct 21, 1966, 56 y.o.   MRN: [id number] [facility] Surgery Progress Note     Subjective: CC-  Only complaint is back pain on the right. Thinks it is from the hospital mattress. Denies abdominal pain, n/v. Tolerating liquids. She had a small loose stool yesterday. WBC down 11.3, afebrile  Objective: Vital signs in last 24 hours: Temp:  [98.2 F (36.8 C)-99.7 F (37.6 C)] 98.2 F (36.8 C) (01/05 0748) Pulse Rate:  [84-91] 86 (01/05 0748) Resp:  [17-18] 17 (01/05 0748) BP: (137-155)/(73-96) 155/96 (01/05 0748) SpO2:  [99 %-100 %] 100 % (01/05 0748)    Intake/Output from previous day: 01/04 0701 - 01/05 0700 In: 800 [P.O.:800] Out: -  Intake/Output this shift: No intake/output data recorded.  PE: Gen:  Alert, NAD, pleasant Pulm:  rate and effort normal Abd: Soft, ND, nontender abdomen  Lab Results:  Recent Labs    12/26/21 0201 12/27/21 0123  WBC 13.5* 11.3*  HGB 11.7* 11.7*  HCT 34.7* 35.5*  PLT 115* 130*   BMET Recent Labs    12/25/21 0605 12/26/21 0201 12/27/21 0123  NA 137 134*  --   K 2.6* 2.6* 3.6  CL 98 100  --   CO2 29 26  --   GLUCOSE 114* 90  --   BUN 15 11  --   CREATININE 0.86 0.79  --   CALCIUM 8.6* 8.3*  --    PT/INR No results for input(s): LABPROT, INR in the last 72 hours. CMP     Component Value Date/Time   NA 134 (L) 12/26/2021 0201   K 3.6 12/27/2021 0123   CL 100 12/26/2021 0201   CO2 26 12/26/2021 0201   GLUCOSE 90 12/26/2021 0201   BUN 11 12/26/2021 0201   CREATININE 0.79 12/26/2021 0201   CALCIUM 8.3 (L) 12/26/2021 0201   PROT 7.1 12/24/2021 1608   ALBUMIN 3.8 12/24/2021 1608   AST 21 12/24/2021 1608   ALT 15 12/24/2021 1608   ALKPHOS 74 12/24/2021 1608   BILITOT 1.1 12/24/2021 1608   GFRNONAA >60 12/26/2021 0201   GFRAA >60 06/07/2019 0309   Lipase     Component Value Date/Time   LIPASE 26 12/24/2021 1608       Studies/Results: No results  found.  Anti-infectives: Anti-infectives (From admission, onward)    Start     Dose/Rate Route Frequency Ordered Stop   12/27/21 1000  amoxicillin-clavulanate (AUGMENTIN) 875-125 MG per tablet 1 tablet        1 tablet Oral Every 12 hours 12/27/21 0819     12/25/21 1000  piperacillin-tazobactam (ZOSYN) IVPB 3.375 g  Status:  Discontinued        3.375 g 12.5 mL/hr over 240 Minutes Intravenous Every 8 hours 12/25/21 0353 12/27/21 0819   12/25/21 0230  piperacillin-tazobactam (ZOSYN) IVPB 3.375 g        3.375 g 100 mL/hr over 30 Minutes Intravenous  Once 12/25/21 0219 12/25/21 0160        Assessment/Plan Diverticulitis with microperforation - 2nd bout - CT scan 1/2 showed acute sigmoid colon diverticulitis with micro perforation, no evidence for abscess - WBC is trending down and pain improving - Transition to oral antibiotics (augmentin) and advance to soft diet. Add probiotic. If patient tolerates this she is ok for discharge later today with oral antibiotics. Recommend GI follow up for colonoscopy in 6-8 weeks. Discussed with patient following up with surgeon after this to discuss elective colon  resection.   ID - zosyn 1/3>>1/5, augmentin 1/5>> FEN - IVF, soft diet VTE - lovenox Foley - none   Hypokalemia  HTN Tobacco abuse - encouraged cessation   Straightforward Medical Decision Making   LOS: 2 days    Franne Forts, [facility] 12/27/2021, 8:20 AM Please see Amion for pager number during day hours 7:00am-4:30pm

## 2021-12-27 NOTE — Progress Notes (Signed)
Reviewed AVS with pt and she verbalized understanding of all DC teaching and instructions. Pt will be going home after tolerating lunch. Family is means of transportation via car.

## 2021-12-27 NOTE — Discharge Summary (Signed)
Debbie Ball NID:[id number] DOB: Jul 28, 1966 DOA: 12/24/2021  PCP: Pcp, No  Admit date: 12/24/2021 Discharge date: 12/27/2021  Admitted From: home Disposition:  home  Recommendations for Outpatient Follow-up:  Follow up with PCP in 1 week Please obtain BMP/CBC in one week Please follow up with primary GI in 6 weeks     Discharge Condition:Stable CODE STATUS:full  Diet recommendation: Heart Healthy , high fiber diet   Brief/Interim Summary: PER HPI: ERRIKA Ball is a pleasant 56 y.o. female with medical history significant for hypertension, GERD, and diverticulitis and June 2020, now presenting to the emergency department with left lower quadrant abdominal pain, chills, and nausea.  1. Acute sigmoid diverticulitis with microperforation Surgery was consulted Was treated with iv antibiotics Will dc with po abx to complete 10day course Tolerated diet Will need to f/u with GI in 6 -8 weeks for cscope     2. Hypokalemia  Replaced and stable    3. Hypertension  stable    Discharge Diagnoses:  Principal Problem:   Acute diverticulitis Active Problems:   Hypertension   Hypokalemia    Discharge Instructions  Discharge Instructions     Call MD for:  severe uncontrolled pain   Complete by: As directed    Diet - low sodium heart healthy   Complete by: As directed    Discharge instructions   Complete by: As directed    Follow up with your primary gastroenterologist in 6 weeks for colonoscopy Follow high fiber diet, avoid seeds   Increase activity slowly   Complete by: As directed       Allergies as of 12/27/2021   No Known Allergies      Medication List     STOP taking these medications    hydrochlorothiazide 25 MG tablet Commonly known as: HYDRODIURIL       TAKE these medications    acetaminophen 500 MG tablet Commonly known as: TYLENOL Take 500 mg by mouth every 6 (six) hours as needed for mild pain.   amoxicillin-clavulanate 1000-62.5 MG 12 hr  tablet Commonly known as: Augmentin XR Take 2 tablets by mouth 2 (two) times daily for 8 days.   atenolol 25 MG tablet Commonly known as: TENORMIN Take 1 tablet (25 mg total) by mouth daily.   cetirizine 10 MG tablet Commonly known as: ZyrTEC Allergy Take 1 tablet (10 mg total) by mouth at bedtime.   fluticasone 50 MCG/ACT nasal spray Commonly known as: FLONASE Place 2 sprays into both nostrils daily.   HYDROcodone-acetaminophen 5-325 MG tablet Commonly known as: NORCO/VICODIN Take 1 tablet by mouth every 6 (six) hours as needed for up to 2 days for moderate pain.   ipratropium 0.06 % nasal spray Commonly known as: ATROVENT Place 2 sprays into both nostrils 4 (four) times daily.   multivitamin with minerals tablet Take 1 tablet by mouth daily.   nicotine 14 mg/24hr patch Commonly known as: NICODERM CQ - dosed in mg/24 hours Place 1 patch (14 mg total) onto the skin daily.   polyethylene glycol 17 g packet Commonly known as: MIRALAX / GLYCOLAX Take 17 g by mouth daily for 14 days.        Follow-up Information     [facility], [place]. Call.   Specialty: General Surgery Why: Call for follow up after your see your gastroenterologist and have a colonoscopy, to discuss possible elective colon surgery Contact information: [address] [place] [address] [phone]  No Known Allergies  Consultations: surgery   Procedures/Studies: CT ABDOMEN PELVIS W CONTRAST  Result Date: 12/24/2021 CLINICAL DATA:  Left lower quadrant abdominal pain and rectal pain. EXAM: CT ABDOMEN AND PELVIS WITH CONTRAST TECHNIQUE: Multidetector CT imaging of the abdomen and pelvis was performed using the standard protocol following bolus administration of intravenous contrast. CONTRAST:  100mL OMNIPAQUE IOHEXOL 300 MG/ML  SOLN COMPARISON:  CT abdomen and pelvis 06/05/2019. FINDINGS: Lower chest: No acute abnormality. Hepatobiliary:  No focal liver abnormality is seen. No gallstones, gallbladder wall thickening, there are 2 subcentimeter hypodensities in the right lobe of the liver favored as small cysts or hemangiomas. Otherwise, the liver, gallbladder and bile ducts are within normal limits. Pancreas: Unremarkable. No pancreatic ductal dilatation or surrounding inflammatory changes. Spleen: Normal in size without focal abnormality. Adrenals/Urinary Tract: Adrenal glands are unremarkable. Kidneys are normal, without renal calculi, focal lesion, or hydronephrosis. Bladder is unremarkable. Stomach/Bowel: The appendix is mildly dilated measuring 8 mm. There is no surrounding inflammation. There is no evidence for bowel obstruction or free air. There is sigmoid colon diverticulosis with mid sigmoid colon wall thickening and surrounding inflammatory stranding compatible with acute diverticulitis. There is a small amount of free air in the adjacent pelvis compatible with micro perforation. No definite drainable fluid collection. Vascular/Lymphatic: No significant vascular findings are present. No enlarged abdominal or pelvic lymph nodes. Reproductive: Uterus and bilateral adnexa are unremarkable. Other: There is a small amount of free fluid and inflammatory stranding in the pelvis. There is no focal abdominal wall hernia. Musculoskeletal: No acute or significant osseous findings. IMPRESSION: 1. Acute sigmoid colon diverticulitis with micro perforation. No evidence for abscess. 2. Small amount of free fluid in the pelvis. 3. Mildly dilated appendix measuring 8 mm. No surrounding inflammation. Appearance is similar to the prior examination and is likely within normal limits for this patient. Please correlate clinically. Electronically Signed   By: Darliss CheneyAmy  Guttmann M.D.   On: 12/24/2021 18:37      Subjective: No complaints. Tolerated diet  Discharge Exam: Vitals:   12/27/21 0344 12/27/21 0748  BP: (!) 141/73 (!) 155/96  Pulse: 91 86  Resp: 18  17  Temp: 99.7 F (37.6 C) 98.2 F (36.8 C)  SpO2: 99% 100%   Vitals:   12/26/21 1451 12/26/21 1954 12/27/21 0344 12/27/21 0748  BP: 138/87 137/79 (!) 141/73 (!) 155/96  Pulse: 90 84 91 86  Resp: 17 18 18 17   Temp: 99.2 F (37.3 C) 99.3 F (37.4 C) 99.7 F (37.6 C) 98.2 F (36.8 C)  TempSrc: Oral Oral Oral Oral  SpO2: 100% 100% 99% 100%    General: Pt is alert, awake, not in acute distress Cardiovascular: RRR, S1/S2 +, no rubs, no gallops Respiratory: CTA bilaterally, no wheezing, no rhonchi Abdominal: Soft, NT, ND, bowel sounds + Extremities: no edema, no cyanosis    The results of significant diagnostics from this hospitalization (including imaging, microbiology, ancillary and laboratory) are listed below for reference.     Microbiology: Recent Results (from the past 240 hour(s))  Resp Panel by RT-PCR (Flu A&B, Covid) Nasopharyngeal Swab     Status: None   Collection Time: 12/25/21  3:13 AM   Specimen: Nasopharyngeal Swab; Nasopharyngeal(NP) swabs in vial transport medium  Result Value Ref Range Status   SARS Coronavirus 2 by RT PCR NEGATIVE NEGATIVE Final    Comment: (NOTE) SARS-CoV-2 target nucleic acids are NOT DETECTED.  The SARS-CoV-2 RNA is generally detectable in upper respiratory specimens during the acute phase  of infection. The lowest concentration of SARS-CoV-2 viral copies this assay can detect is 138 copies/mL. A negative result does not preclude SARS-Cov-2 infection and should not be used as the sole basis for treatment or other patient management decisions. A negative result may occur with  improper specimen collection/handling, submission of specimen other than nasopharyngeal swab, presence of viral mutation(s) within the areas targeted by this assay, and inadequate number of viral copies(<138 copies/mL). A negative result must be combined with clinical observations, patient history, and epidemiological information. The expected result is  Negative.  Fact Sheet for Patients:  BloggerCourse.comhttps://www.fda.gov/media/152166/download  Fact Sheet for Healthcare Providers:  SeriousBroker.ithttps://www.fda.gov/media/152162/download  This test is no t yet approved or cleared by the [place] FDA and  has been authorized for detection and/or diagnosis of SARS-CoV-2 by FDA under an Emergency Use Authorization (EUA). This EUA will remain  in effect (meaning this test can be used) for the duration of the COVID-19 declaration under Section 564(b)(1) of the Act, 21 U.S.C.section 360bbb-3(b)(1), unless the authorization is terminated  or revoked sooner.       Influenza A by PCR NEGATIVE NEGATIVE Final   Influenza B by PCR NEGATIVE NEGATIVE Final    Comment: (NOTE) The Xpert Xpress SARS-CoV-2/FLU/RSV plus assay is intended as an aid in the diagnosis of influenza from Nasopharyngeal swab specimens and should not be used as a sole basis for treatment. Nasal washings and aspirates are unacceptable for Xpert Xpress SARS-CoV-2/FLU/RSV testing.  Fact Sheet for Patients: BloggerCourse.comhttps://www.fda.gov/media/152166/download  Fact Sheet for Healthcare Providers: SeriousBroker.ithttps://www.fda.gov/media/152162/download  This test is not yet approved or cleared by the [place] FDA and has been authorized for detection and/or diagnosis of SARS-CoV-2 by FDA under an Emergency Use Authorization (EUA). This EUA will remain in effect (meaning this test can be used) for the duration of the COVID-19 declaration under Section 564(b)(1) of the Act, 21 U.S.C. section 360bbb-3(b)(1), unless the authorization is terminated or revoked.  Performed at [facility], [address] [place], [place] [id number]      Labs: BNP (last 3 results) No results for input(s): BNP in the last 8760 hours. Basic Metabolic Panel: Recent Labs  Lab 12/24/21 1608 12/25/21 0605 12/26/21 0201 12/27/21 0123  NA 139 137 134*  --   K 3.0* 2.6* 2.6* 3.6  CL 101 98 100  --   CO2 27 29 26   --    GLUCOSE 131* 114* 90  --   BUN 18 15 11   --   CREATININE 0.74 0.86 0.79  --   CALCIUM 9.2 8.6* 8.3*  --   MG  --  1.6*  --   --    Liver Function Tests: Recent Labs  Lab 12/24/21 1608  AST 21  ALT 15  ALKPHOS 74  BILITOT 1.1  PROT 7.1  ALBUMIN 3.8   Recent Labs  Lab 12/24/21 1608  LIPASE 26   No results for input(s): AMMONIA in the last 168 hours. CBC: Recent Labs  Lab 12/24/21 1608 12/25/21 0605 12/26/21 0201 12/27/21 0123  WBC 14.6* 15.1* 13.5* 11.3*  HGB 13.4 12.7 11.7* 11.7*  HCT 39.6 36.2 34.7* 35.5*  MCV 97.3 95.5 96.7 97.8  PLT 138* 130* 115* 130*   Cardiac Enzymes: No results for input(s): CKTOTAL, CKMB, CKMBINDEX, TROPONINI in the last 168 hours. BNP: Invalid input(s): POCBNP CBG: No results for input(s): GLUCAP in the last 168 hours. D-Dimer No results for input(s): DDIMER in the last 72 hours. Hgb A1c No results for input(s):  HGBA1C in the last 72 hours. Lipid Profile No results for input(s): CHOL, HDL, LDLCALC, TRIG, CHOLHDL, LDLDIRECT in the last 72 hours. Thyroid function studies No results for input(s): TSH, T4TOTAL, T3FREE, THYROIDAB in the last 72 hours.  Invalid input(s): FREET3 Anemia work up No results for input(s): VITAMINB12, FOLATE, FERRITIN, TIBC, IRON, RETICCTPCT in the last 72 hours. Urinalysis    Component Value Date/Time   COLORURINE YELLOW 06/05/2019 0041   APPEARANCEUR CLEAR 06/05/2019 0041   LABSPEC 1.017 06/05/2019 0041   PHURINE 6.0 06/05/2019 0041   GLUCOSEU NEGATIVE 06/05/2019 0041   HGBUR SMALL (A) 06/05/2019 0041   BILIRUBINUR NEGATIVE 06/05/2019 0041   KETONESUR 5 (A) 06/05/2019 0041   PROTEINUR 30 (A) 06/05/2019 0041   NITRITE NEGATIVE 06/05/2019 0041   LEUKOCYTESUR NEGATIVE 06/05/2019 0041   Sepsis Labs Invalid input(s): PROCALCITONIN,  WBC,  LACTICIDVEN Microbiology Recent Results (from the past 240 hour(s))  Resp Panel by RT-PCR (Flu A&B, Covid) Nasopharyngeal Swab     Status: None   Collection Time:  12/25/21  3:13 AM   Specimen: Nasopharyngeal Swab; Nasopharyngeal(NP) swabs in vial transport medium  Result Value Ref Range Status   SARS Coronavirus 2 by RT PCR NEGATIVE NEGATIVE Final    Comment: (NOTE) SARS-CoV-2 target nucleic acids are NOT DETECTED.  The SARS-CoV-2 RNA is generally detectable in upper respiratory specimens during the acute phase of infection. The lowest concentration of SARS-CoV-2 viral copies this assay can detect is 138 copies/mL. A negative result does not preclude SARS-Cov-2 infection and should not be used as the sole basis for treatment or other patient management decisions. A negative result may occur with  improper specimen collection/handling, submission of specimen other than nasopharyngeal swab, presence of viral mutation(s) within the areas targeted by this assay, and inadequate number of viral copies(<138 copies/mL). A negative result must be combined with clinical observations, patient history, and epidemiological information. The expected result is Negative.  Fact Sheet for Patients:  BloggerCourse.com  Fact Sheet for Healthcare Providers:  SeriousBroker.it  This test is no t yet approved or cleared by the [place] FDA and  has been authorized for detection and/or diagnosis of SARS-CoV-2 by FDA under an Emergency Use Authorization (EUA). This EUA will remain  in effect (meaning this test can be used) for the duration of the COVID-19 declaration under Section 564(b)(1) of the Act, 21 U.S.C.section 360bbb-3(b)(1), unless the authorization is terminated  or revoked sooner.       Influenza A by PCR NEGATIVE NEGATIVE Final   Influenza B by PCR NEGATIVE NEGATIVE Final    Comment: (NOTE) The Xpert Xpress SARS-CoV-2/FLU/RSV plus assay is intended as an aid in the diagnosis of influenza from Nasopharyngeal swab specimens and should not be used as a sole basis for treatment. Nasal washings  and aspirates are unacceptable for Xpert Xpress SARS-CoV-2/FLU/RSV testing.  Fact Sheet for Patients: BloggerCourse.com  Fact Sheet for Healthcare Providers: SeriousBroker.it  This test is not yet approved or cleared by the [place] FDA and has been authorized for detection and/or diagnosis of SARS-CoV-2 by FDA under an Emergency Use Authorization (EUA). This EUA will remain in effect (meaning this test can be used) for the duration of the COVID-19 declaration under Section 564(b)(1) of the Act, 21 U.S.C. section 360bbb-3(b)(1), unless the authorization is terminated or revoked.  Performed at [facility], [address], [place], [place] [address]      Time coordinating discharge: Over 30 minutes  SIGNED:   Lynn Ito, MD  [facility] 12/27/2021, 8:39 AM Pager   If 7PM-7AM, please contact night-coverage [email or web address] Password [email or web address]

## 2022-02-13 ENCOUNTER — Other Ambulatory Visit: Payer: Self-pay

## 2022-02-13 ENCOUNTER — Ambulatory Visit
Admission: EM | Admit: 2022-02-13 | Discharge: 2022-02-13 | Disposition: A | Discharge status: Home / Self Care | Payer: Managed Care, Other (non HMO) | Attending: Physician Assistant | Admitting: Physician Assistant

## 2022-02-13 DIAGNOSIS — I10 Essential (primary) hypertension: Secondary | ICD-10-CM

## 2022-02-13 MED ORDER — ATENOLOL 25 MG PO TABS: 25.0000 mg | ORAL_TABLET | Freq: Every day | ORAL | 0 refills | Status: DC

## 2022-02-13 NOTE — ED Provider Notes (Signed)
[name]    CSN: [id number] Arrival date & time: 02/13/22  1337      History   Chief Complaint Chief Complaint  Patient presents with   Medication Refill    HPI Debbie Ball is a 56 y.o. female.   Patient here today for refill of her atenolol.  She reports that she has done well with medication and denies any side effects.  She does have appointment for follow-up the beginning of April with PCP.  The history is provided by the patient.  Medication Refill  Past Medical History:  Diagnosis Date   GERD (gastroesophageal reflux disease)    Hypertension    Tobacco abuse     Patient Active Problem List   Diagnosis Date Noted   Acute diverticulitis 12/25/2021   Sigmoid diverticulitis 06/05/2019   Hypertension    GERD (gastroesophageal reflux disease)    Tobacco abuse    Hypokalemia     Past Surgical History:  Procedure Laterality Date   INDUCED ABORTION      OB History     Gravida  4   Para  2   Term  2   Preterm      AB  1   Living  2      SAB      IAB  1   Ectopic      Multiple      Live Births               Home Medications    Prior to Admission medications   Medication Sig Start Date End Date Taking? Authorizing Provider  acetaminophen (TYLENOL) 500 MG tablet Take 500 mg by mouth every 6 (six) hours as needed for mild pain.    [provider]  atenolol (TENORMIN) 25 MG tablet Take 1 tablet (25 mg total) by mouth daily. 02/13/22 04/14/22  Tomi Bamberger, PA-C  cetirizine (ZYRTEC ALLERGY) 10 MG tablet Take 1 tablet (10 mg total) by mouth at bedtime. 11/03/21 02/01/22  Theadora Rama Scales, PA-C  fluticasone (FLONASE) 50 MCG/ACT nasal spray Place 2 sprays into both nostrils daily. 11/03/21 02/01/22  Theadora Rama Scales, PA-C  ipratropium (ATROVENT) 0.06 % nasal spray Place 2 sprays into both nostrils 4 (four) times daily. 11/03/21 12/25/21  Theadora Rama Scales, PA-C  Multiple Vitamins-Minerals (MULTIVITAMIN  WITH MINERALS) tablet Take 1 tablet by mouth daily.    [provider]  nicotine (NICODERM CQ - DOSED IN MG/24 HOURS) 14 mg/24hr patch Place 1 patch (14 mg total) onto the skin daily. Patient not taking: Reported on 12/25/2021 06/07/19   Calvert Cantor, MD    Family History Family History  Problem Relation Age of Onset   Hypertension Father    Hypertension Sister    Other Neg Hx     Social History Social History   Tobacco Use   Smoking status: Every Day    Packs/day: 0.50    Years: 15.00    Pack years: 7.50    Types: Cigarettes   Smokeless tobacco: Never  Substance Use Topics   Alcohol use: Yes    Comment: Socially    Drug use: No     Allergies   Patient has no known allergies.   Review of Systems Review of Systems  Constitutional:  Negative for chills and fever.  Eyes:  Negative for discharge and redness.  Respiratory:  Negative for shortness of breath and wheezing.   Cardiovascular:  Negative for chest pain.  Gastrointestinal:  Negative  for abdominal pain, nausea and vomiting.    Physical Exam Triage Vital Signs ED Triage Vitals [02/13/22 1357]  Enc Vitals Group     BP 140/86     Pulse Rate 90     Resp 18     Temp 98.6 F (37 C)     Temp Source Oral     SpO2 98 %     Weight      Height      Head Circumference      Peak Flow      Pain Score 0     Pain Loc      Pain Edu?      Excl. in [place]?    No data found.  Updated Vital Signs BP 140/86 (BP Location: Left Arm)    Pulse 90    Temp 98.6 F (37 C) (Oral)    Resp 18    SpO2 98%   Physical Exam Vitals and nursing note reviewed.  Constitutional:      General: She is not in acute distress.    Appearance: Normal appearance. She is not ill-appearing.  HENT:     Head: Normocephalic and atraumatic.  Eyes:     Conjunctiva/sclera: Conjunctivae normal.  Cardiovascular:     Rate and Rhythm: Normal rate and regular rhythm.     Heart sounds: Normal heart sounds. No murmur heard. Pulmonary:      Effort: Pulmonary effort is normal. No respiratory distress.     Breath sounds: Normal breath sounds. No wheezing, rhonchi or rales.  Neurological:     Mental Status: She is alert.  Psychiatric:        Mood and Affect: Mood normal.        Behavior: Behavior normal.        Thought Content: Thought content normal.     UC Treatments / Results  Labs (all labs ordered are listed, but only abnormal results are displayed) Labs Reviewed - No data to display  EKG   Radiology No results found.  Procedures Procedures (including critical care time)  Medications Ordered in UC Medications - No data to display  Initial Impression / Assessment and Plan / UC Course  I have reviewed the triage vital signs and the nursing notes.  Pertinent labs & imaging results that were available during my care of the patient were reviewed by me and considered in my medical decision making (see chart for details).    Atenolol refilled at previous dose.  Recommended follow-up as scheduled.  Encouraged follow-up sooner with any further concerns.  Final Clinical Impressions(s) / UC Diagnoses   Final diagnoses:  Essential hypertension   Discharge Instructions   None    ED Prescriptions     Medication Sig Dispense Auth. Provider   atenolol (TENORMIN) 25 MG tablet Take 1 tablet (25 mg total) by mouth daily. 60 tablet Francene Finders, PA-C      PDMP not reviewed this encounter.   Francene Finders, PA-C 02/13/22 1426

## 2022-02-13 NOTE — ED Triage Notes (Signed)
Pt here for atenolol refill. Has been out for 2 days ago. States next apt is "first part of April."

## 2022-05-24 ENCOUNTER — Ambulatory Visit (HOSPITAL_COMMUNITY)
Admission: EM | Admit: 2022-05-24 | Discharge: 2022-05-24 | Disposition: A | Discharge status: Home / Self Care | Payer: Managed Care, Other (non HMO)

## 2022-05-24 ENCOUNTER — Ambulatory Visit (INDEPENDENT_AMBULATORY_CARE_PROVIDER_SITE_OTHER): Payer: Managed Care, Other (non HMO)

## 2022-05-24 ENCOUNTER — Encounter (HOSPITAL_COMMUNITY): Payer: Self-pay | Admitting: Emergency Medicine

## 2022-05-24 DIAGNOSIS — M25561 Pain in right knee: Secondary | ICD-10-CM

## 2022-05-24 DIAGNOSIS — I10 Essential (primary) hypertension: Secondary | ICD-10-CM | POA: Diagnosis not present

## 2022-05-24 MED ORDER — DICLOFENAC SODIUM 1 % EX GEL: 4.0000 g | Freq: Four times a day (QID) | CUTANEOUS | 0 refills | Status: DC

## 2022-05-24 MED ORDER — TIZANIDINE HCL 4 MG PO TABS: 4.0000 mg | ORAL_TABLET | Freq: Every evening | ORAL | 0 refills | Status: DC | PRN

## 2022-05-24 MED ORDER — ATENOLOL 25 MG PO TABS: 25.0000 mg | ORAL_TABLET | Freq: Every day | ORAL | 0 refills | Status: DC

## 2022-05-24 MED ORDER — ATENOLOL 25 MG PO TABS
ORAL_TABLET | ORAL | Status: AC
  Filled 2022-05-24: qty 1

## 2022-05-24 MED ORDER — ATENOLOL 25 MG PO TABS
25.0000 mg | ORAL_TABLET | Freq: Once | ORAL | Status: AC
Start: 2022-05-24 — End: 2022-05-24
  Administered 2022-05-24: 25 mg via ORAL

## 2022-05-24 MED ORDER — HYDROCHLOROTHIAZIDE 25 MG PO TABS: 25.0000 mg | ORAL_TABLET | Freq: Every day | ORAL | 0 refills | Status: DC

## 2022-05-24 NOTE — ED Triage Notes (Addendum)
Pt reports right knee pain that started at work yesterday afternoon. Denies any falls or injury.   Pt needing refill on HTN medications until can get in with PCP

## 2022-05-24 NOTE — ED Provider Notes (Signed)
[name]    CSN: [id number] Arrival date & time: 05/24/22  0947      History   Chief Complaint Chief Complaint  Patient presents with   Knee Pain   Medication Refill    HPI Debbie Ball is a 56 y.o. female.   HPI Patient with a history of uncontrolled hypertension, tobacco abuse presents today for medication refill for hypertensive medication. Patient has previously taken HCTZ along with atenolol for management of hypertension.  She is without PCP.  Her blood pressure on arrival was 190/103. Rechecked manually 162/102. She has been without blood pressure medication since last weekend. Denies any SOB, swelling, or chest pain.  Right knee pain, developed times yesterday.  She denies any known injury however she works of occupation which she is on her feet walking for prolonged periods of time during the day and also involves occasional lifting.  The pain is localized to the lower mid patellar tendon region.  She has not noticed any swelling.  She reports the pain was worse with laying in bed overnight.  She denies any prior recent knee problems although endorses 20 years ago having some acute problems with her knee that did not require surgical intervention. Past Medical History:  Diagnosis Date   GERD (gastroesophageal reflux disease)    Hypertension    Tobacco abuse     Patient Active Problem List   Diagnosis Date Noted   Acute diverticulitis 12/25/2021   Sigmoid diverticulitis 06/05/2019   Hypertension    GERD (gastroesophageal reflux disease)    Tobacco abuse    Hypokalemia     Past Surgical History:  Procedure Laterality Date   INDUCED ABORTION      OB History     Gravida  4   Para  2   Term  2   Preterm      AB  1   Living  2      SAB      IAB  1   Ectopic      Multiple      Live Births               Home Medications    Prior to Admission medications   Medication Sig Start Date End Date Taking? Authorizing Provider   diclofenac Sodium (VOLTAREN) 1 % GEL Apply 4 g topically 4 (four) times daily. 05/24/22  Yes Scot Jun, FNP  hydrochlorothiazide (HYDRODIURIL) 25 MG tablet Take 1 tablet (25 mg total) by mouth daily. 05/24/22  Yes Scot Jun, FNP  tiZANidine (ZANAFLEX) 4 MG tablet Take 1 tablet (4 mg total) by mouth at bedtime as needed and may repeat dose one time if needed for muscle spasms. 05/24/22  Yes Scot Jun, FNP  acetaminophen (TYLENOL) 500 MG tablet Take 500 mg by mouth every 6 (six) hours as needed for mild pain.    [provider]  atenolol (TENORMIN) 25 MG tablet Take 1 tablet (25 mg total) by mouth daily. 05/24/22 07/23/22  Scot Jun, FNP  cetirizine (ZYRTEC ALLERGY) 10 MG tablet Take 1 tablet (10 mg total) by mouth at bedtime. 11/03/21 02/01/22  Lynden Oxford Scales, PA-C  fluticasone (FLONASE) 50 MCG/ACT nasal spray Place 2 sprays into both nostrils daily. 11/03/21 02/01/22  Lynden Oxford Scales, PA-C  ipratropium (ATROVENT) 0.06 % nasal spray Place 2 sprays into both nostrils 4 (four) times daily. 11/03/21 12/25/21  Lynden Oxford Scales, PA-C  Multiple Vitamins-Minerals (MULTIVITAMIN WITH MINERALS) tablet Take 1  tablet by mouth daily.    [provider]  nicotine (NICODERM CQ - DOSED IN MG/24 HOURS) 14 mg/24hr patch Place 1 patch (14 mg total) onto the skin daily. Patient not taking: Reported on 12/25/2021 06/07/19   Debbe Odea, MD    Family History Family History  Problem Relation Age of Onset   Hypertension Father    Hypertension Sister    Other Neg Hx     Social History Social History   Tobacco Use   Smoking status: Every Day    Packs/day: 0.50    Years: 15.00    Pack years: 7.50    Types: Cigarettes   Smokeless tobacco: Never  Substance Use Topics   Alcohol use: Yes    Comment: Socially    Drug use: No     Allergies   Patient has no known allergies.   Review of Systems Review of Systems Pertinent negatives listed in HPI    Physical Exam Triage Vital Signs ED Triage Vitals  Enc Vitals Group     BP 05/24/22 1058 (!) 190/103     Pulse Rate 05/24/22 1058 73     Resp 05/24/22 1058 18     Temp 05/24/22 1058 98.6 F (37 C)     Temp Source 05/24/22 1058 Oral     SpO2 05/24/22 1058 97 %     Weight --      Height --      Head Circumference --      Peak Flow --      Pain Score 05/24/22 1056 8     Pain Loc --      Pain Edu? --      Excl. in [place]? --    No data found.  Updated Vital Signs BP (!) 162/102 (BP Location: Right Arm)   Pulse 73   Temp 98.6 F (37 C) (Oral)   Resp 18   SpO2 97%   Visual Acuity Right Eye Distance:   Left Eye Distance:   Bilateral Distance:    Right Eye Near:   Left Eye Near:    Bilateral Near:     Physical Exam Constitutional:      Appearance: Normal appearance.  HENT:     Head: Normocephalic and atraumatic.  Eyes:     Extraocular Movements: Extraocular movements intact.     Pupils: Pupils are equal, round, and reactive to light.  Cardiovascular:     Rate and Rhythm: Normal rate and regular rhythm.  Pulmonary:     Effort: Pulmonary effort is normal.     Breath sounds: Normal breath sounds.  Musculoskeletal:     Cervical back: Normal range of motion.     Right knee: Bony tenderness present. No swelling. Tenderness present over the medial joint line and patellar tendon. No MCL tenderness.     Right lower leg: No edema.     Left lower leg: No edema.  Skin:    Capillary Refill: Capillary refill takes less than 2 seconds.  Neurological:     General: No focal deficit present.     Mental Status: She is alert and oriented to person, place, and time.  Psychiatric:        Mood and Affect: Mood normal.        Behavior: Behavior normal.        Thought Content: Thought content normal.        Judgment: Judgment normal.   UC Treatments / Results  Labs (all labs ordered are listed,  but only abnormal results are displayed) Labs Reviewed - No data to  display  EKG   Radiology DG Knee Complete 4 Views Right  Result Date: 05/24/2022 CLINICAL DATA:  Right knee pain, MCL EXAM: RIGHT KNEE - COMPLETE 4+ VIEW COMPARISON:  None Available. FINDINGS: Alignment is anatomic. No acute fracture. Joint spaces are preserved. No joint effusion. IMPRESSION: Negative. Electronically Signed   By: Macy Mis M.D.   On: 05/24/2022 11:59    Procedures Procedures (including critical care time)  Medications Ordered in UC Medications  atenolol (TENORMIN) tablet 25 mg (25 mg Oral Given 05/24/22 1205)    Initial Impression / Assessment and Plan / UC Course  I have reviewed the triage vital signs and the nursing notes.  Pertinent labs & imaging results that were available during my care of the patient were reviewed by me and considered in my medical decision making (see chart for details).     Accelerated hypertension patient given dose of atenolol here in clinic.  Refill patient's atenolol and HCTZ.  She has an appointment with her primary care provider this month. Right knee pain, imaging negative for any acute or chronic changes.  Recommend conservative pain management with topical Voltaren, Tylenol and tizanidine for acute pain.  Patient is unable to take NSAIDs due to GI history.  Advised if symptoms persist to follow-up with orthopedics for further work-up and evaluation of knee pain. Final Clinical Impressions(s) / UC Diagnoses   Final diagnoses:  Accelerated hypertension  Acute pain of right knee     Discharge Instructions      I have refilled your blood pressure medication.  Continue to monitor your blood pressure while at home and be sure to keep your follow-up appointment with your new primary care provider.  Your x-ray is negative for any chronic or acute changes that would describe your acute onset pain.  I suspect he may have suffered a muscle strain as you are on your feet for prolonged periods of time.  Recommend Tylenol 500 mg every 6  hours.  I have also prescribed you Voltaren gel which is an anti-inflammatory topical gel as you should not take any oral anti-inflammatories due to your GI history. If pain persist I have included information to follow-up with [facility] for further work-up and diagnostic testing of your knee pain.     ED Prescriptions     Medication Sig Dispense Auth. Provider   atenolol (TENORMIN) 25 MG tablet Take 1 tablet (25 mg total) by mouth daily. 60 tablet Scot Jun, FNP   hydrochlorothiazide (HYDRODIURIL) 25 MG tablet Take 1 tablet (25 mg total) by mouth daily. 60 tablet Scot Jun, FNP   diclofenac Sodium (VOLTAREN) 1 % GEL Apply 4 g topically 4 (four) times daily. 100 g Scot Jun, FNP   tiZANidine (ZANAFLEX) 4 MG tablet Take 1 tablet (4 mg total) by mouth at bedtime as needed and may repeat dose one time if needed for muscle spasms. 30 tablet Scot Jun, FNP      PDMP not reviewed this encounter.   Reona, Hodge, FNP 05/24/22 [phone]

## 2022-05-24 NOTE — Discharge Instructions (Addendum)
I have refilled your blood pressure medication.  Continue to monitor your blood pressure while at home and be sure to keep your follow-up appointment with your new primary care provider.  Your x-ray is negative for any chronic or acute changes that would describe your acute onset pain.  I suspect he may have suffered a muscle strain as you are on your feet for prolonged periods of time.  Recommend Tylenol 500 mg every 6 hours.  I have also prescribed you Voltaren gel which is an anti-inflammatory topical gel as you should not take any oral anti-inflammatories due to your GI history. If pain persist I have included information to follow-up with [facility] for further work-up and diagnostic testing of your knee pain.

## 2022-06-16 ENCOUNTER — Other Ambulatory Visit: Payer: Self-pay | Admitting: Family Medicine

## 2022-06-17 ENCOUNTER — Ambulatory Visit
Admission: EM | Admit: 2022-06-17 | Discharge: 2022-06-17 | Disposition: A | Discharge status: Home / Self Care | Payer: Managed Care, Other (non HMO) | Attending: Internal Medicine | Admitting: Internal Medicine

## 2022-06-17 DIAGNOSIS — H109 Unspecified conjunctivitis: Secondary | ICD-10-CM

## 2022-06-17 MED ORDER — ERYTHROMYCIN 5 MG/GM OP OINT: TOPICAL_OINTMENT | OPHTHALMIC | 0 refills | Status: DC

## 2022-07-25 ENCOUNTER — Other Ambulatory Visit: Payer: Self-pay | Admitting: Family Medicine

## 2022-07-25 NOTE — Telephone Encounter (Signed)
No encounters with Georganna Skeans, MD. Schedule appointment.

## 2022-09-19 ENCOUNTER — Ambulatory Visit
Admission: EM | Admit: 2022-09-19 | Discharge: 2022-09-19 | Disposition: A | Discharge status: Home / Self Care | Payer: Managed Care, Other (non HMO) | Attending: Physician Assistant | Admitting: Physician Assistant

## 2022-09-19 ENCOUNTER — Encounter: Payer: Self-pay | Admitting: Emergency Medicine

## 2022-09-19 DIAGNOSIS — I10 Essential (primary) hypertension: Secondary | ICD-10-CM | POA: Diagnosis not present

## 2022-09-19 DIAGNOSIS — H1033 Unspecified acute conjunctivitis, bilateral: Secondary | ICD-10-CM | POA: Diagnosis not present

## 2022-09-19 DIAGNOSIS — J0110 Acute frontal sinusitis, unspecified: Secondary | ICD-10-CM

## 2022-09-19 MED ORDER — ATENOLOL 25 MG PO TABS: 25.0000 mg | ORAL_TABLET | Freq: Every day | ORAL | 0 refills | Status: DC

## 2022-09-19 MED ORDER — SULFAMETHOXAZOLE-TRIMETHOPRIM 800-160 MG PO TABS
1.0000 | ORAL_TABLET | Freq: Two times a day (BID) | ORAL | 0 refills | Status: AC
Start: 2022-09-19 — End: 2022-09-26

## 2022-09-19 MED ORDER — POLYMYXIN B-TRIMETHOPRIM 10000-0.1 UNIT/ML-% OP SOLN: 1.0000 [drp] | OPHTHALMIC | 0 refills | Status: DC

## 2022-09-19 NOTE — ED Triage Notes (Addendum)
Pt is present today with left eye drainage started x3 days ago.   Pt also states that she ran out of her BP medication x4 days ago. Pt states that she has been experiencing occasional HA but denies any SOB or chest pain.

## 2022-09-19 NOTE — ED Provider Notes (Signed)
[name]    CSN: [id number] Arrival date & time: 09/19/22  1043      History   Chief Complaint Chief Complaint  Patient presents with   Eye Drainage   Hypertension    HPI Debbie Ball is a 56 y.o. female.   56 year old female presents with sinus pressure and congestion, bilateral eye drainage, and hypertension.  Patient indicates for the past several days she has been having some upper respiratory symptoms with sinus pressure and congestion mainly frontal and maxillary, rhinitis, postnasal drip with production being yellow and purulent.  Patient also relates that she has been having bilateral redness, irritation, and wakes up with the eyes matting together.  She indicates that she has not had any vision changes and has not been around any individuals similar type symptoms.  She indicates she believes that it is coming from her sinuses since she is extremely congested in the nasal and sinus areas.  She denies any fever or chills, and has no respiratory cough. Patient also indicates that she has hypertension and she has been out of her blood pressure medicine for a while and requests a refill.  She indicates that she takes Tenormin and does well on this medication without any problems or side effects.  She indicates she does have an appointment with a new PCP which is in a couple months.  She request to have her blood pressure medicine renewed.   Hypertension    Past Medical History:  Diagnosis Date   GERD (gastroesophageal reflux disease)    Hypertension    Tobacco abuse     Patient Active Problem List   Diagnosis Date Noted   Acute diverticulitis 12/25/2021   Sigmoid diverticulitis 06/05/2019   Hypertension    GERD (gastroesophageal reflux disease)    Tobacco abuse    Hypokalemia     Past Surgical History:  Procedure Laterality Date   INDUCED ABORTION      OB History     Gravida  4   Para  2   Term  2   Preterm      AB  1   Living  2       SAB      IAB  1   Ectopic      Multiple      Live Births               Home Medications    Prior to Admission medications   Medication Sig Start Date End Date Taking? Authorizing Provider  sulfamethoxazole-trimethoprim (BACTRIM DS) 800-160 MG tablet Take 1 tablet by mouth 2 (two) times daily for 7 days. 09/19/22 09/26/22 Yes Nyoka Lint, PA-C  trimethoprim-polymyxin b (POLYTRIM) ophthalmic solution Place 1 drop into both eyes every 4 (four) hours. 09/19/22  Yes Nyoka Lint, PA-C  acetaminophen (TYLENOL) 500 MG tablet Take 500 mg by mouth every 6 (six) hours as needed for mild pain.    [provider]  atenolol (TENORMIN) 25 MG tablet Take 1 tablet (25 mg total) by mouth daily. 09/19/22 12/18/22  Nyoka Lint, PA-C  cetirizine (ZYRTEC ALLERGY) 10 MG tablet Take 1 tablet (10 mg total) by mouth at bedtime. 11/03/21 02/01/22  Lynden Oxford Scales, PA-C  diclofenac Sodium (VOLTAREN) 1 % GEL Apply 4 g topically 4 (four) times daily. 05/24/22   Scot Jun, FNP  erythromycin ophthalmic ointment Place a 1/2 inch ribbon of ointment into the lower eyelid 4 times daily for 7 days. 06/17/22   [place],  Michele Rockers, FNP  fluticasone (FLONASE) 50 MCG/ACT nasal spray Place 2 sprays into both nostrils daily. 11/03/21 02/01/22  Lynden Oxford Scales, PA-C  hydrochlorothiazide (HYDRODIURIL) 25 MG tablet Take 1 tablet (25 mg total) by mouth daily. 05/24/22   Scot Jun, FNP  ipratropium (ATROVENT) 0.06 % nasal spray Place 2 sprays into both nostrils 4 (four) times daily. 11/03/21 12/25/21  Lynden Oxford Scales, PA-C  Multiple Vitamins-Minerals (MULTIVITAMIN WITH MINERALS) tablet Take 1 tablet by mouth daily.    [provider]  nicotine (NICODERM CQ - DOSED IN MG/24 HOURS) 14 mg/24hr patch Place 1 patch (14 mg total) onto the skin daily. Patient not taking: Reported on 12/25/2021 06/07/19   Debbe Odea, MD  tiZANidine (ZANAFLEX) 4 MG tablet Take 1 tablet (4 mg total) by mouth at  bedtime as needed and may repeat dose one time if needed for muscle spasms. 05/24/22   Scot Jun, FNP    Family History Family History  Problem Relation Age of Onset   Hypertension Father    Hypertension Sister    Other Neg Hx     Social History Social History   Tobacco Use   Smoking status: Every Day    Packs/day: 0.50    Years: 15.00    Total pack years: 7.50    Types: Cigarettes   Smokeless tobacco: Never  Substance Use Topics   Alcohol use: Yes    Comment: Socially    Drug use: No     Allergies   Patient has no known allergies.   Review of Systems Review of Systems  HENT:  Positive for postnasal drip, rhinorrhea and sinus pressure.   Eyes:  Positive for redness (both eyes).     Physical Exam Triage Vital Signs ED Triage Vitals [09/19/22 1055]  Enc Vitals Group     BP (!) 163/101     Pulse Rate 92     Resp 18     Temp 97.9 F (36.6 C)     Temp src      SpO2 96 %     Weight      Height      Head Circumference      Peak Flow      Pain Score 0     Pain Loc      Pain Edu?      Excl. in [place]?    No data found.  Updated Vital Signs BP (!) 163/101   Pulse 92   Temp 97.9 F (36.6 C)   Resp 18   SpO2 96%   Visual Acuity Right Eye Distance:   Left Eye Distance:   Bilateral Distance:    Right Eye Near:   Left Eye Near:    Bilateral Near:     Physical Exam Constitutional:      Appearance: Normal appearance.  HENT:     Right Ear: Ear canal normal. Tympanic membrane is injected.     Left Ear: Ear canal normal. Tympanic membrane is injected.     Mouth/Throat:     Mouth: Mucous membranes are moist.     Pharynx: Oropharynx is clear. No posterior oropharyngeal erythema.     Comments: Facial: There is tenderness on palpation of the frontal and maxillary sinuses. Eyes:     General: Lids are normal.     Extraocular Movements: Extraocular movements intact.     Comments: Eyes: Bilateral conjunctiva mildly injected.  No drainage noted  bilaterally.  Cardiovascular:     Rate and  Rhythm: Normal rate and regular rhythm.     Heart sounds: Normal heart sounds.  Pulmonary:     Effort: Pulmonary effort is normal.     Breath sounds: Normal breath sounds and air entry. No wheezing, rhonchi or rales.  Lymphadenopathy:     Cervical: No cervical adenopathy.  Neurological:     Mental Status: She is alert.      UC Treatments / Results  Labs (all labs ordered are listed, but only abnormal results are displayed) Labs Reviewed - No data to display  EKG   Radiology No results found.  Procedures Procedures (including critical care time)  Medications Ordered in UC Medications - No data to display  Initial Impression / Assessment and Plan / UC Course  I have reviewed the triage vital signs and the nursing notes.  Pertinent labs & imaging results that were available during my care of the patient were reviewed by me and considered in my medical decision making (see chart for details).    Plan: 1.  Sinusitis will be treated with the following: A.  Bactrim DS every 12 hours until completed to treat the sinus infection. 2.  The conjunctivitis will be treated with the following. A.  Polytrim, 1 drop each eye every 6 hours for the next couple days until infection clears. 3.  The hypertension will be treated with the following: A.  Tenormin 25 mg daily. 4.  Advised follow-up PCP return to urgent care if symptoms fail to improve. Final Clinical Impressions(s) / UC Diagnoses   Final diagnoses:  Acute non-recurrent frontal sinusitis  Acute bacterial conjunctivitis of both eyes  Essential hypertension     Discharge Instructions      Advised to use Coricidin-Hb for the congestion of the sinuses. Advised to use the eyedrops Polytrim, every 6-8 hours to help clear the eyes over the next couple days. Advised take Bactrim DS every 12 hours to treat the sinuses. The atenolol/Tenormin has been sent to the pharmacy and a 90-day  supply has been given. Advised to follow-up with PCP or return to urgent care if symptoms fail to improve.    ED Prescriptions     Medication Sig Dispense Auth. Provider   atenolol (TENORMIN) 25 MG tablet Take 1 tablet (25 mg total) by mouth daily. 90 tablet Nyoka Lint, PA-C   trimethoprim-polymyxin b (POLYTRIM) ophthalmic solution Place 1 drop into both eyes every 4 (four) hours. 10 mL Nyoka Lint, PA-C   sulfamethoxazole-trimethoprim (BACTRIM DS) 800-160 MG tablet Take 1 tablet by mouth 2 (two) times daily for 7 days. 14 tablet Nyoka Lint, PA-C      PDMP not reviewed this encounter.   Nyoka Lint, PA-C 09/19/22 1135

## 2022-09-19 NOTE — Discharge Instructions (Addendum)
Advised to use Coricidin-Hb for the congestion of the sinuses. Advised to use the eyedrops Polytrim, every 6-8 hours to help clear the eyes over the next couple days. Advised take Bactrim DS every 12 hours to treat the sinuses. The atenolol/Tenormin has been sent to the pharmacy and a 90-day supply has been given. Advised to follow-up with PCP or return to urgent care if symptoms fail to improve.

## 2023-01-20 ENCOUNTER — Encounter (HOSPITAL_COMMUNITY): Payer: Self-pay

## 2023-01-20 ENCOUNTER — Ambulatory Visit (HOSPITAL_COMMUNITY)
Admission: RE | Admit: 2023-01-20 | Discharge: 2023-01-20 | Disposition: A | Discharge status: Home / Self Care | Payer: Managed Care, Other (non HMO) | Source: Ambulatory Visit | Attending: Internal Medicine | Admitting: Internal Medicine

## 2023-01-20 VITALS — BP 160/99 | HR 93 | Temp 98.1°F | Resp 17

## 2023-01-20 DIAGNOSIS — Z760 Encounter for issue of repeat prescription: Secondary | ICD-10-CM | POA: Insufficient documentation

## 2023-01-20 DIAGNOSIS — I10 Essential (primary) hypertension: Secondary | ICD-10-CM | POA: Insufficient documentation

## 2023-01-20 LAB — CBC
HCT: 40.2 % (ref 36.0–46.0)
Hemoglobin: 13.4 g/dL (ref 12.0–15.0)
MCH: 32.4 pg (ref 26.0–34.0)
MCHC: 33.3 g/dL (ref 30.0–36.0)
MCV: 97.1 fL (ref 80.0–100.0)
Platelets: 172 10*3/uL (ref 150–400)
RBC: 4.14 MIL/uL (ref 3.87–5.11)
RDW: 14.4 % (ref 11.5–15.5)
WBC: 7.4 10*3/uL (ref 4.0–10.5)
nRBC: 0 % (ref 0.0–0.2)

## 2023-01-20 LAB — BASIC METABOLIC PANEL
Anion gap: 8 (ref 5–15)
BUN: 18 mg/dL (ref 6–20)
CO2: 29 mmol/L (ref 22–32)
Calcium: 9.3 mg/dL (ref 8.9–10.3)
Chloride: 104 mmol/L (ref 98–111)
Creatinine, Ser: 0.93 mg/dL (ref 0.44–1.00)
GFR, Estimated: >60 mL/min (ref >60)
Glucose, Bld: 95 mg/dL (ref 70–99)
Potassium: 3.7 mmol/L (ref 3.5–5.1)
Sodium: 141 mmol/L (ref 135–145)

## 2023-01-20 MED ORDER — HYDROCHLOROTHIAZIDE 25 MG PO TABS: 25.0000 mg | ORAL_TABLET | Freq: Every day | ORAL | 0 refills | Status: DC

## 2023-01-20 MED ORDER — ATENOLOL 25 MG PO TABS: 25.0000 mg | ORAL_TABLET | Freq: Every day | ORAL | 0 refills | Status: DC

## 2023-01-20 NOTE — ED Triage Notes (Signed)
Pt reports out of her HTN medications for about 2 weeks. Got in with PCP but they didn't get along so looking for another. Pt requesting HTN med refill.  Had some headaches

## 2023-01-20 NOTE — ED Provider Notes (Signed)
Debbie Ball    CSN: [id number] Arrival date & time: 01/20/23  1515      History   Chief Complaint Chief Complaint  Patient presents with   appt 330    HPI Debbie Ball is a 57 y.o. female.   Patient presents urgent care for medication refill of hydrochlorothiazide and atenolol.  Patient is in between PCPs and would like medication refill.  She has been without her medication for 1.5 weeks. She has a history of hypertension, GERD, and tobacco abuse.  Denies headaches, blurry vision, abdominal pain, chest pain, shortness of breath, and heart palpitations.  She is a current every day cigarette smoker, denies all other drug use. BP currently 160/99.      Past Medical History:  Diagnosis Date   GERD (gastroesophageal reflux disease)    Hypertension    Tobacco abuse     Patient Active Problem List   Diagnosis Date Noted   Acute diverticulitis 12/25/2021   Sigmoid diverticulitis 06/05/2019   Hypertension    GERD (gastroesophageal reflux disease)    Tobacco abuse    Hypokalemia     Past Surgical History:  Procedure Laterality Date   INDUCED ABORTION      OB History     Gravida  4   Para  2   Term  2   Preterm      AB  1   Living  2      SAB      IAB  1   Ectopic      Multiple      Live Births               Home Medications    Prior to Admission medications   Medication Sig Start Date End Date Taking? Authorizing Provider  acetaminophen (TYLENOL) 500 MG tablet Take 500 mg by mouth every 6 (six) hours as needed for mild pain.    [provider]  atenolol (TENORMIN) 25 MG tablet Take 1 tablet (25 mg total) by mouth daily. 01/20/23 02/19/23  Talbot Grumbling, FNP  cetirizine (ZYRTEC ALLERGY) 10 MG tablet Take 1 tablet (10 mg total) by mouth at bedtime. 11/03/21 02/01/22  Lynden Oxford Scales, PA-C  diclofenac Sodium (VOLTAREN) 1 % GEL Apply 4 g topically 4 (four) times daily. 05/24/22   Scot Jun, NP   erythromycin ophthalmic ointment Place a 1/2 inch ribbon of ointment into the lower eyelid 4 times daily for 7 days. 06/17/22   Teodora Medici, FNP  fluticasone (FLONASE) 50 MCG/ACT nasal spray Place 2 sprays into both nostrils daily. 11/03/21 02/01/22  Lynden Oxford Scales, PA-C  hydrochlorothiazide (HYDRODIURIL) 25 MG tablet Take 1 tablet (25 mg total) by mouth daily. 01/20/23   Talbot Grumbling, FNP  ipratropium (ATROVENT) 0.06 % nasal spray Place 2 sprays into both nostrils 4 (four) times daily. 11/03/21 12/25/21  Lynden Oxford Scales, PA-C  Multiple Vitamins-Minerals (MULTIVITAMIN WITH MINERALS) tablet Take 1 tablet by mouth daily.    [provider]  nicotine (NICODERM CQ - DOSED IN MG/24 HOURS) 14 mg/24hr patch Place 1 patch (14 mg total) onto the skin daily. Patient not taking: Reported on 12/25/2021 06/07/19   Debbe Odea, MD  tiZANidine (ZANAFLEX) 4 MG tablet Take 1 tablet (4 mg total) by mouth at bedtime as needed and may repeat dose one time if needed for muscle spasms. 05/24/22   Scot Jun, NP  trimethoprim-polymyxin b (POLYTRIM) ophthalmic solution Place 1 drop into both  eyes every 4 (four) hours. 09/19/22   Nyoka Lint, PA-C    Family History Family History  Problem Relation Age of Onset   Hypertension Father    Hypertension Sister    Other Neg Hx     Social History Social History   Tobacco Use   Smoking status: Every Day    Packs/day: 0.50    Years: 15.00    Total pack years: 7.50    Types: Cigarettes   Smokeless tobacco: Never  Substance Use Topics   Alcohol use: Yes    Comment: Socially    Drug use: No     Allergies   Patient has no known allergies.   Review of Systems Review of Systems Per HPI  Physical Exam Triage Vital Signs ED Triage Vitals  Enc Vitals Group     BP 01/20/23 1541 (!) 160/99     Pulse Rate 01/20/23 1541 93     Resp 01/20/23 1541 17     Temp 01/20/23 1541 98.1 F (36.7 C)     Temp Source 01/20/23 1541 Oral      SpO2 01/20/23 1541 98 %     Weight --      Height --      Head Circumference --      Peak Flow --      Pain Score 01/20/23 1539 5     Pain Loc --      Pain Edu? --      Excl. in [place]? --    No data found.  Updated Vital Signs BP (!) 160/99 (BP Location: Right Arm)   Pulse 93   Temp 98.1 F (36.7 C) (Oral)   Resp 17   SpO2 98%   Visual Acuity Right Eye Distance:   Left Eye Distance:   Bilateral Distance:    Right Eye Near:   Left Eye Near:    Bilateral Near:     Physical Exam Vitals and nursing note reviewed.  Constitutional:      Appearance: She is not ill-appearing or toxic-appearing.  HENT:     Head: Normocephalic and atraumatic.     Right Ear: Hearing and external ear normal.     Left Ear: Hearing and external ear normal.     Nose: Nose normal.     Mouth/Throat:     Lips: Pink.  Eyes:     General: Lids are normal. Vision grossly intact. Gaze aligned appropriately.     Extraocular Movements: Extraocular movements intact.     Conjunctiva/sclera: Conjunctivae normal.  Cardiovascular:     Rate and Rhythm: Normal rate and regular rhythm.     Heart sounds: Normal heart sounds, S1 normal and S2 normal.  Pulmonary:     Effort: Pulmonary effort is normal. No respiratory distress.     Breath sounds: Normal breath sounds and air entry.  Musculoskeletal:     Cervical back: Neck supple.  Skin:    General: Skin is warm and dry.     Capillary Refill: Capillary refill takes less than 2 seconds.     Findings: No rash.  Neurological:     General: No focal deficit present.     Mental Status: She is alert and oriented to person, place, and time. Mental status is at baseline.     Cranial Nerves: No dysarthria or facial asymmetry.  Psychiatric:        Mood and Affect: Mood normal.        Speech: Speech normal.  Behavior: Behavior normal.        Thought Content: Thought content normal.        Judgment: Judgment normal.      UC Treatments / Results   Labs (all labs ordered are listed, but only abnormal results are displayed) Labs Reviewed  CBC  BASIC METABOLIC PANEL    EKG   Radiology No results found.  Procedures Procedures (including critical care time)  Medications Ordered in UC Medications - No data to display  Initial Impression / Assessment and Plan / UC Course  I have reviewed the triage vital signs and the nursing notes.  Pertinent labs & imaging results that were available during my care of the patient were reviewed by me and considered in my medical decision making (see chart for details).   1.  Essential hypertension Medication sent to pharmacy.  Patient is not displaying any signs of endorgan damage.  PCP follow-up recommended for ongoing evaluation and management of hypertension.  Most recent blood work I am able to see this from January 2023.  I would like to draw CBC and BMP for medication monitoring as patient is taking hydrochlorothiazide.  She is agreeable with this.  Basic blood work drawn.  I will call patient if blood work is abnormal.   Discussed physical exam and available lab work findings in clinic with patient.  Counseled patient regarding appropriate use of medications and potential side effects for all medications recommended or prescribed today. Discussed red flag signs and symptoms of worsening condition,when to call the PCP office, return to urgent care, and when to seek higher level of care in the emergency department. Patient verbalizes understanding and agreement with plan. All questions answered. Patient discharged in stable condition.    Final Clinical Impressions(s) / UC Diagnoses   Final diagnoses:  Essential hypertension  Medication refill     Discharge Instructions      Take BP medication as prescribed.  Lower the amount of salt in your diet to less than 1 gram of salt per day and increase exercise to naturally lower BP. Review information provided regarding high blood  pressure.  Schedule an appointment with primary care provider for ongoing management of high blood pressure and for routine healthcare screenings.  Please go to the ER if you develop any severe symptoms such as chest pain, sudden shortness of breath, or one-sided weakness. I hope you feel better!    ED Prescriptions     Medication Sig Dispense Auth. Provider   hydrochlorothiazide (HYDRODIURIL) 25 MG tablet Take 1 tablet (25 mg total) by mouth daily. 30 tablet Carlisle Beers, FNP   atenolol (TENORMIN) 25 MG tablet Take 1 tablet (25 mg total) by mouth daily. 30 tablet Carlisle Beers, FNP      PDMP not reviewed this encounter.   Carlisle Beers, [place] 01/20/23 1625

## 2023-01-20 NOTE — Discharge Instructions (Signed)
Take BP medication as prescribed.  Lower the amount of salt in your diet to less than 1 gram of salt per day and increase exercise to naturally lower BP. Review information provided regarding high blood pressure.  Schedule an appointment with primary care provider for ongoing management of high blood pressure and for routine healthcare screenings.  Please go to the ER if you develop any severe symptoms such as chest pain, sudden shortness of breath, or one-sided weakness. I hope you feel better!

## 2023-04-04 ENCOUNTER — Encounter: Payer: Self-pay | Admitting: Emergency Medicine

## 2023-04-04 ENCOUNTER — Ambulatory Visit
Admission: EM | Admit: 2023-04-04 | Discharge: 2023-04-04 | Disposition: A | Discharge status: Home / Self Care | Payer: Managed Care, Other (non HMO) | Attending: Family Medicine | Admitting: Family Medicine

## 2023-04-04 DIAGNOSIS — I10 Essential (primary) hypertension: Secondary | ICD-10-CM | POA: Diagnosis not present

## 2023-04-04 DIAGNOSIS — H66002 Acute suppurative otitis media without spontaneous rupture of ear drum, left ear: Secondary | ICD-10-CM | POA: Diagnosis not present

## 2023-04-04 DIAGNOSIS — J3089 Other allergic rhinitis: Secondary | ICD-10-CM | POA: Diagnosis not present

## 2023-04-04 MED ORDER — AMOXICILLIN-POT CLAVULANATE 875-125 MG PO TABS: 1.0000 | ORAL_TABLET | Freq: Two times a day (BID) | ORAL | 0 refills | Status: DC

## 2023-04-04 MED ORDER — ATENOLOL 25 MG PO TABS: 25.0000 mg | ORAL_TABLET | Freq: Every day | ORAL | 0 refills | Status: DC

## 2023-04-04 MED ORDER — HYDROCHLOROTHIAZIDE 25 MG PO TABS: 25.0000 mg | ORAL_TABLET | Freq: Every day | ORAL | 0 refills | Status: DC

## 2023-04-04 NOTE — Discharge Instructions (Addendum)
You were seen for ear pain and allergy symptoms.  I am treating you for an ear infection with augmentin twice/day x 10 days.  You should continue the anti-histamine and flonase for your allergy symptoms.  I have refilled your blood pressure medications today.  Please make an appointment with a primary care provider for further care and refills.

## 2023-04-04 NOTE — ED Triage Notes (Signed)
Pt presents for left ear fullness for a couple weeks and a medication refill. States she also noticed her eyes are watering.   Needs a refill on Hydrochlorothiazide 25 mg  and Atenolol 25 mg. Has been out of medication for about a week and a half.

## 2023-04-04 NOTE — ED Provider Notes (Signed)
[name]    CSN: [id number] Arrival date & time: 04/04/23  1551      History   Chief Complaint Chief Complaint  Patient presents with   Ear Fullness   Medication Refill    HPI Debbie Ball is a 57 y.o. female.   Patient is here for left ear fullness for several weeks.  Slightly painful.  Unable to hear out of it.  She does feel congested, some runny nose.  Having watery eyes as well.  She did take mucinex, allergy medication and flonase.   She has been out of her bp medication for about 10 days, needs a refill of hctz  and atenolol .  She has seen a pcp, but did not like him/her, and looking for another.  Her bp is elevated today, but normally 130s/90.  She does have a slight headache, no dizziness.        Past Medical History:  Diagnosis Date   GERD (gastroesophageal reflux disease)    Hypertension    Tobacco abuse     Patient Active Problem List   Diagnosis Date Noted   Acute diverticulitis 12/25/2021   Sigmoid diverticulitis 06/05/2019   Hypertension    GERD (gastroesophageal reflux disease)    Tobacco abuse    Hypokalemia     Past Surgical History:  Procedure Laterality Date   INDUCED ABORTION      OB History     Gravida  4   Para  2   Term  2   Preterm      AB  1   Living  2      SAB      IAB  1   Ectopic      Multiple      Live Births               Home Medications    Prior to Admission medications   Medication Sig Start Date End Date Taking? Authorizing Provider  acetaminophen (TYLENOL) 500 MG tablet Take 500 mg by mouth every 6 (six) hours as needed for mild pain.    [provider]  atenolol (TENORMIN) 25 MG tablet Take 1 tablet (25 mg total) by mouth daily. 01/20/23 02/19/23  Carlisle Beers, FNP  cetirizine (ZYRTEC ALLERGY) 10 MG tablet Take 1 tablet (10 mg total) by mouth at bedtime. 11/03/21 02/01/22  Theadora Rama Scales, PA-C  diclofenac Sodium (VOLTAREN) 1 % GEL Apply 4 g  topically 4 (four) times daily. 05/24/22   Bing Neighbors, NP  erythromycin ophthalmic ointment Place a 1/2 inch ribbon of ointment into the lower eyelid 4 times daily for 7 days. 06/17/22   Gustavus Bryant, FNP  fluticasone (FLONASE) 50 MCG/ACT nasal spray Place 2 sprays into both nostrils daily. 11/03/21 02/01/22  Theadora Rama Scales, PA-C  hydrochlorothiazide (HYDRODIURIL) 25 MG tablet Take 1 tablet (25 mg total) by mouth daily. 01/20/23   Carlisle Beers, FNP  ipratropium (ATROVENT) 0.06 % nasal spray Place 2 sprays into both nostrils 4 (four) times daily. 11/03/21 12/25/21  Theadora Rama Scales, PA-C  Multiple Vitamins-Minerals (MULTIVITAMIN WITH MINERALS) tablet Take 1 tablet by mouth daily.    [provider]  nicotine (NICODERM CQ - DOSED IN MG/24 HOURS) 14 mg/24hr patch Place 1 patch (14 mg total) onto the skin daily. Patient not taking: Reported on 12/25/2021 06/07/19   Calvert Cantor, MD  tiZANidine (ZANAFLEX) 4 MG tablet Take 1 tablet (4 mg total) by mouth at bedtime  as needed and may repeat dose one time if needed for muscle spasms. 05/24/22   Bing Neighbors, NP  trimethoprim-polymyxin b (POLYTRIM) ophthalmic solution Place 1 drop into both eyes every 4 (four) hours. 09/19/22   Ellsworth Lennox, PA-C    Family History Family History  Problem Relation Age of Onset   Hypertension Father    Hypertension Sister    Other Neg Hx     Social History Social History   Tobacco Use   Smoking status: Every Day    Packs/day: 0.50    Years: 15.00    Additional pack years: 0.00    Total pack years: 7.50    Types: Cigarettes   Smokeless tobacco: Never  Substance Use Topics   Alcohol use: Yes    Comment: Socially    Drug use: No     Allergies   Patient has no known allergies.   Review of Systems Review of Systems  Constitutional: Negative.   HENT:  Positive for congestion and ear pain. Negative for rhinorrhea.   Respiratory: Negative.    Cardiovascular: Negative.    Gastrointestinal: Negative.   Musculoskeletal: Negative.   Neurological:  Positive for headaches.     Physical Exam Triage Vital Signs ED Triage Vitals  Enc Vitals Group     BP 04/04/23 1613 (!) 193/108     Pulse Rate 04/04/23 1613 89     Resp 04/04/23 1613 17     Temp 04/04/23 1613 97.9 F (36.6 C)     Temp Source 04/04/23 1613 Oral     SpO2 04/04/23 1613 97 %     Weight --      Height --      Head Circumference --      Peak Flow --      Pain Score 04/04/23 1614 3     Pain Loc --      Pain Edu? --      Excl. in [place]? --    No data found.  Updated Vital Signs BP (!) 193/108 (BP Location: Right Arm)   Pulse 89   Temp 97.9 F (36.6 C) (Oral)   Resp 17   SpO2 97%   Visual Acuity Right Eye Distance:   Left Eye Distance:   Bilateral Distance:    Right Eye Near:   Left Eye Near:    Bilateral Near:     Physical Exam Constitutional:      Appearance: Normal appearance.  HENT:     Right Ear: Tympanic membrane normal.     Left Ear: Tympanic membrane is erythematous and bulging.     Nose: Nose normal.     Mouth/Throat:     Mouth: Mucous membranes are moist.  Cardiovascular:     Rate and Rhythm: Normal rate and regular rhythm.  Pulmonary:     Effort: Pulmonary effort is normal.     Breath sounds: Normal breath sounds.  Musculoskeletal:     Cervical back: Normal range of motion and neck supple. No tenderness.  Neurological:     General: No focal deficit present.     Mental Status: She is alert.  Psychiatric:        Mood and Affect: Mood normal.      UC Treatments / Results  Labs (all labs ordered are listed, but only abnormal results are displayed) Labs Reviewed - No data to display  EKG   Radiology No results found.  Procedures Procedures (including critical care time)  Medications Ordered in UC Medications -  No data to display  Initial Impression / Assessment and Plan / UC Course  I have reviewed the triage vital signs and the nursing  notes.  Pertinent labs & imaging results that were available during my care of the patient were reviewed by me and considered in my medical decision making (see chart for details).  Final Clinical Impressions(s) / UC Diagnoses   Final diagnoses:  Non-recurrent acute suppurative otitis media of left ear without spontaneous rupture of tympanic membrane  Non-seasonal allergic rhinitis, unspecified trigger  Essential hypertension     Discharge Instructions      You were seen for ear pain and allergy symptoms.  I am treating you for an ear infection with augmentin twice/day x 10 days.  You should continue the anti-histamine and flonase for your allergy symptoms.  I have refilled your blood pressure medications today.  Please make an appointment with a primary care provider for further care and refills.     ED Prescriptions     Medication Sig Dispense Auth. Provider   atenolol (TENORMIN) 25 MG tablet Take 1 tablet (25 mg total) by mouth daily. 30 tablet Hoa Briggs, MD   hydrochlorothiazide (HYDRODIURIL) 25 MG tablet Take 1 tablet (25 mg total) by mouth daily. 30 tablet Vivianne Carles, MD   amoxicillin-clavulanate (AUGMENTIN) 875-125 MG tablet Take 1 tablet by mouth every 12 (twelve) hours. 20 tablet Jannifer Franklin, MD      PDMP not reviewed this encounter.   Jannifer Franklin, MD 04/04/23 [phone]

## 2023-06-05 ENCOUNTER — Ambulatory Visit (INDEPENDENT_AMBULATORY_CARE_PROVIDER_SITE_OTHER): Payer: Managed Care, Other (non HMO) | Admitting: Family Medicine

## 2023-06-05 ENCOUNTER — Encounter: Payer: Self-pay | Admitting: Family Medicine

## 2023-06-05 VITALS — BP 165/111 | HR 76 | Temp 98.1°F | Resp 16 | Ht 66.0 in | Wt 213.8 lb

## 2023-06-05 DIAGNOSIS — Z720 Tobacco use: Secondary | ICD-10-CM

## 2023-06-05 DIAGNOSIS — I10 Essential (primary) hypertension: Secondary | ICD-10-CM

## 2023-06-05 DIAGNOSIS — K219 Gastro-esophageal reflux disease without esophagitis: Secondary | ICD-10-CM

## 2023-06-05 DIAGNOSIS — Z7689 Persons encountering health services in other specified circumstances: Secondary | ICD-10-CM

## 2023-06-05 MED ORDER — TRIAMTERENE-HCTZ 37.5-25 MG PO TABS: 1.0000 | ORAL_TABLET | Freq: Every day | ORAL | 0 refills | Status: DC

## 2023-06-05 MED ORDER — ATENOLOL 25 MG PO TABS: 25.0000 mg | ORAL_TABLET | Freq: Every day | ORAL | 0 refills | Status: DC

## 2023-06-05 NOTE — Progress Notes (Signed)
Patient is here to established care with provider. ~health hx address ~care gaps address  

## 2023-06-05 NOTE — Progress Notes (Signed)
New Patient Office Visit  Subjective    Patient ID: Debbie Ball, female    DOB: 1966-02-07  Age: 57 y.o. MRN: [id number]  CC:  Chief Complaint  Patient presents with   Establish Care    HPI Debbie Ball presents to establish care and for review of chronic med issues. Patient denies acute complaints but reports that she was unable to get refills  from her previous providers while awaiting this appt.    Outpatient Encounter Medications as of 06/05/2023  Medication Sig   acetaminophen (TYLENOL) 500 MG tablet Take 500 mg by mouth every 6 (six) hours as needed for mild pain.   diclofenac Sodium (VOLTAREN) 1 % GEL Apply 4 g topically 4 (four) times daily.   erythromycin ophthalmic ointment Place a 1/2 inch ribbon of ointment into the lower eyelid 4 times daily for 7 days.   hydrochlorothiazide (HYDRODIURIL) 25 MG tablet Take 1 tablet (25 mg total) by mouth daily.   Multiple Vitamins-Minerals (MULTIVITAMIN WITH MINERALS) tablet Take 1 tablet by mouth daily.   tiZANidine (ZANAFLEX) 4 MG tablet Take 1 tablet (4 mg total) by mouth at bedtime as needed and may repeat dose one time if needed for muscle spasms.   [DISCONTINUED] triamterene-hydrochlorothiazide (MAXZIDE-25) 37.5-25 MG tablet Take 1 tablet by mouth daily.   amoxicillin-clavulanate (AUGMENTIN) 875-125 MG tablet Take 1 tablet by mouth every 12 (twelve) hours. (Patient not taking: Reported on 06/05/2023)   atenolol (TENORMIN) 25 MG tablet Take 1 tablet (25 mg total) by mouth daily.   cetirizine (ZYRTEC ALLERGY) 10 MG tablet Take 1 tablet (10 mg total) by mouth at bedtime.   fluticasone (FLONASE) 50 MCG/ACT nasal spray Place 2 sprays into both nostrils daily.   ipratropium (ATROVENT) 0.06 % nasal spray Place 2 sprays into both nostrils 4 (four) times daily.   nicotine (NICODERM CQ - DOSED IN MG/24 HOURS) 14 mg/24hr patch Place 1 patch (14 mg total) onto the skin daily. (Patient not taking: Reported on 12/25/2021)    triamterene-hydrochlorothiazide (MAXZIDE-25) 37.5-25 MG tablet Take 1 tablet by mouth daily.   trimethoprim-polymyxin b (POLYTRIM) ophthalmic solution Place 1 drop into both eyes every 4 (four) hours. (Patient not taking: Reported on 06/05/2023)   [DISCONTINUED] atenolol (TENORMIN) 25 MG tablet Take 1 tablet (25 mg total) by mouth daily.   [DISCONTINUED] atenolol (TENORMIN) 25 MG tablet Take 1 tablet (25 mg total) by mouth daily.   [DISCONTINUED] atenolol (TENORMIN) 25 MG tablet Take 1 tablet (25 mg total) by mouth daily.   [DISCONTINUED] triamterene-hydrochlorothiazide (MAXZIDE-25) 37.5-25 MG tablet Take 1 tablet by mouth daily.   No facility-administered encounter medications on file as of 06/05/2023.    Past Medical History:  Diagnosis Date   GERD (gastroesophageal reflux disease)    Hypertension    Tobacco abuse     Past Surgical History:  Procedure Laterality Date   INDUCED ABORTION      Family History  Problem Relation Age of Onset   Hypertension Father    Hypertension Sister    Other Neg Hx     Social History   Socioeconomic History   Marital status: Single    Spouse name: Not on file   Number of children: Not on file   Years of education: Not on file   Highest education level: Not on file  Occupational History   Not on file  Tobacco Use   Smoking status: Every Day    Packs/day: 0.50    Years: 15.00  Additional pack years: 0.00    Total pack years: 7.50    Types: Cigarettes   Smokeless tobacco: Never  Substance and Sexual Activity   Alcohol use: Yes    Comment: Socially    Drug use: No   Sexual activity: Yes  Other Topics Concern   Not on file  Social History Narrative   Not on file   Social Determinants of Health   Financial Resource Strain: Not on file  Food Insecurity: Not on file  Transportation Needs: Not on file  Physical Activity: Not on file  Stress: Not on file  Social Connections: Not on file  Intimate Partner Violence: Not on file     Review of Systems  All other systems reviewed and are negative.       Objective    BP (!) 165/111   Pulse 76   Temp 98.1 F (36.7 C) (Oral)   Resp 16   Ht 5\' 6"  (1.676 m)   Wt 213 lb 13.6 oz (97 kg)   SpO2 98%   BMI 34.52 kg/m   Physical Exam Vitals and nursing note reviewed.  Constitutional:      General: She is not in acute distress. Cardiovascular:     Rate and Rhythm: Normal rate and regular rhythm.  Pulmonary:     Effort: Pulmonary effort is normal.     Breath sounds: Normal breath sounds.  Abdominal:     Palpations: Abdomen is soft.     Tenderness: There is no abdominal tenderness.  Neurological:     General: No focal deficit present.     Mental Status: She is alert and oriented to person, place, and time.         Assessment & Plan:   1. Uncontrolled hypertension Discussed compliance. Patient meds chenged from HCTZ to maxide 2/2 to  reported history of low potassium.   2. Gastroesophageal reflux disease without esophagitis Appears stable. continue  3. Tobacco abuse Discussed cessation/reduction  4. Encounter to establish care     Return in 2 weeks (on 06/19/2023) for physical.   Debbie Raymond, MD

## 2023-06-06 ENCOUNTER — Ambulatory Visit: Payer: Managed Care, Other (non HMO) | Admitting: Family Medicine

## 2023-06-19 ENCOUNTER — Ambulatory Visit: Payer: Managed Care, Other (non HMO) | Admitting: Family Medicine

## 2023-07-09 ENCOUNTER — Ambulatory Visit (INDEPENDENT_AMBULATORY_CARE_PROVIDER_SITE_OTHER): Payer: Managed Care, Other (non HMO) | Admitting: Family Medicine

## 2023-07-09 VITALS — BP 124/81 | HR 72 | Temp 98.6°F | Resp 14 | Ht 66.0 in | Wt 214.0 lb

## 2023-07-09 DIAGNOSIS — Z720 Tobacco use: Secondary | ICD-10-CM | POA: Diagnosis not present

## 2023-07-09 DIAGNOSIS — I10 Essential (primary) hypertension: Secondary | ICD-10-CM | POA: Diagnosis not present

## 2023-07-09 MED ORDER — TRIAMTERENE-HCTZ 37.5-25 MG PO TABS: 1.0000 | ORAL_TABLET | Freq: Every day | ORAL | 1 refills | Status: DC

## 2023-07-09 MED ORDER — ATENOLOL 25 MG PO TABS: 25.0000 mg | ORAL_TABLET | Freq: Every day | ORAL | 1 refills | Status: DC

## 2023-07-14 ENCOUNTER — Encounter: Payer: Self-pay | Admitting: Family Medicine

## 2023-07-14 NOTE — Progress Notes (Signed)
Established Patient Office Visit  Subjective    Patient ID: Debbie Ball, female    DOB: October 09, 1966  Age: 57 y.o. MRN: [id number]  CC:  Chief Complaint  Patient presents with   Follow-up    HPI Debbie Ball presents for follow up of hypertension. Patient denies acute complaints.    Outpatient Encounter Medications as of 07/09/2023  Medication Sig   acetaminophen (TYLENOL) 500 MG tablet Take 500 mg by mouth every 6 (six) hours as needed for mild pain.   diclofenac Sodium (VOLTAREN) 1 % GEL Apply 4 g topically 4 (four) times daily.   erythromycin ophthalmic ointment Place a 1/2 inch ribbon of ointment into the lower eyelid 4 times daily for 7 days.   hydrochlorothiazide (HYDRODIURIL) 25 MG tablet Take 1 tablet (25 mg total) by mouth daily.   Multiple Vitamins-Minerals (MULTIVITAMIN WITH MINERALS) tablet Take 1 tablet by mouth daily.   nicotine (NICODERM CQ - DOSED IN MG/24 HOURS) 14 mg/24hr patch Place 1 patch (14 mg total) onto the skin daily.   tiZANidine (ZANAFLEX) 4 MG tablet Take 1 tablet (4 mg total) by mouth at bedtime as needed and may repeat dose one time if needed for muscle spasms.   trimethoprim-polymyxin b (POLYTRIM) ophthalmic solution Place 1 drop into both eyes every 4 (four) hours.   [DISCONTINUED] triamterene-hydrochlorothiazide (MAXZIDE-25) 37.5-25 MG tablet Take 1 tablet by mouth daily.   amoxicillin-clavulanate (AUGMENTIN) 875-125 MG tablet Take 1 tablet by mouth every 12 (twelve) hours. (Patient not taking: Reported on 07/09/2023)   atenolol (TENORMIN) 25 MG tablet Take 1 tablet (25 mg total) by mouth daily.   cetirizine (ZYRTEC ALLERGY) 10 MG tablet Take 1 tablet (10 mg total) by mouth at bedtime.   fluticasone (FLONASE) 50 MCG/ACT nasal spray Place 2 sprays into both nostrils daily.   ipratropium (ATROVENT) 0.06 % nasal spray Place 2 sprays into both nostrils 4 (four) times daily.   triamterene-hydrochlorothiazide (MAXZIDE-25) 37.5-25 MG tablet Take 1 tablet by  mouth daily.   [DISCONTINUED] atenolol (TENORMIN) 25 MG tablet Take 1 tablet (25 mg total) by mouth daily.   No facility-administered encounter medications on file as of 07/09/2023.    Past Medical History:  Diagnosis Date   GERD (gastroesophageal reflux disease)    Hypertension    Tobacco abuse     Past Surgical History:  Procedure Laterality Date   INDUCED ABORTION      Family History  Problem Relation Age of Onset   Hypertension Father    Hypertension Sister    Other Neg Hx     Social History   Socioeconomic History   Marital status: Single    Spouse name: Not on file   Number of children: Not on file   Years of education: Not on file   Highest education level: Not on file  Occupational History   Not on file  Tobacco Use   Smoking status: Every Day    Current packs/day: 0.50    Average packs/day: 0.5 packs/day for 15.0 years (7.5 ttl pk-yrs)    Types: Cigarettes   Smokeless tobacco: Never  Substance and Sexual Activity   Alcohol use: Yes    Comment: Socially    Drug use: No   Sexual activity: Yes  Other Topics Concern   Not on file  Social History Narrative   Not on file   Social Determinants of Health   Financial Resource Strain: Not on file  Food Insecurity: Not on file  Transportation Needs: Not  on file  Physical Activity: Not on file  Stress: Not on file  Social Connections: Not on file  Intimate Partner Violence: Not on file    Review of Systems  All other systems reviewed and are negative.       Objective    BP 124/81 (BP Location: Right Arm, Patient Position: Sitting, Cuff Size: Normal)   Pulse 72   Temp 98.6 F (37 C)   Resp 14   Ht 5\' 6"  (1.676 m)   Wt 214 lb (97.1 kg)   SpO2 98%   BMI 34.54 kg/m   Physical Exam Vitals and nursing note reviewed.  Constitutional:      General: She is not in acute distress. Cardiovascular:     Rate and Rhythm: Normal rate and regular rhythm.  Pulmonary:     Effort: Pulmonary effort is  normal.     Breath sounds: Normal breath sounds.  Abdominal:     Palpations: Abdomen is soft.     Tenderness: There is no abdominal tenderness.  Neurological:     General: No focal deficit present.     Mental Status: She is alert and oriented to person, place, and time.         Assessment & Plan:   1. Essential hypertension Much improved with present management. Continue   2. Tobacco abuse Discussed reduction/cessation    Return in about 3 months (around 10/09/2023) for follow up.   Tommie Raymond, MD

## 2023-08-03 IMAGING — DX DG KNEE COMPLETE 4+V*R*
4 series · 4 of 4 positions shown · non-contrast
Comparison: None Available.

CLINICAL DATA: Right knee pain, MCL

EXAM:
RIGHT KNEE - COMPLETE 4+ VIEW

[knee ap]
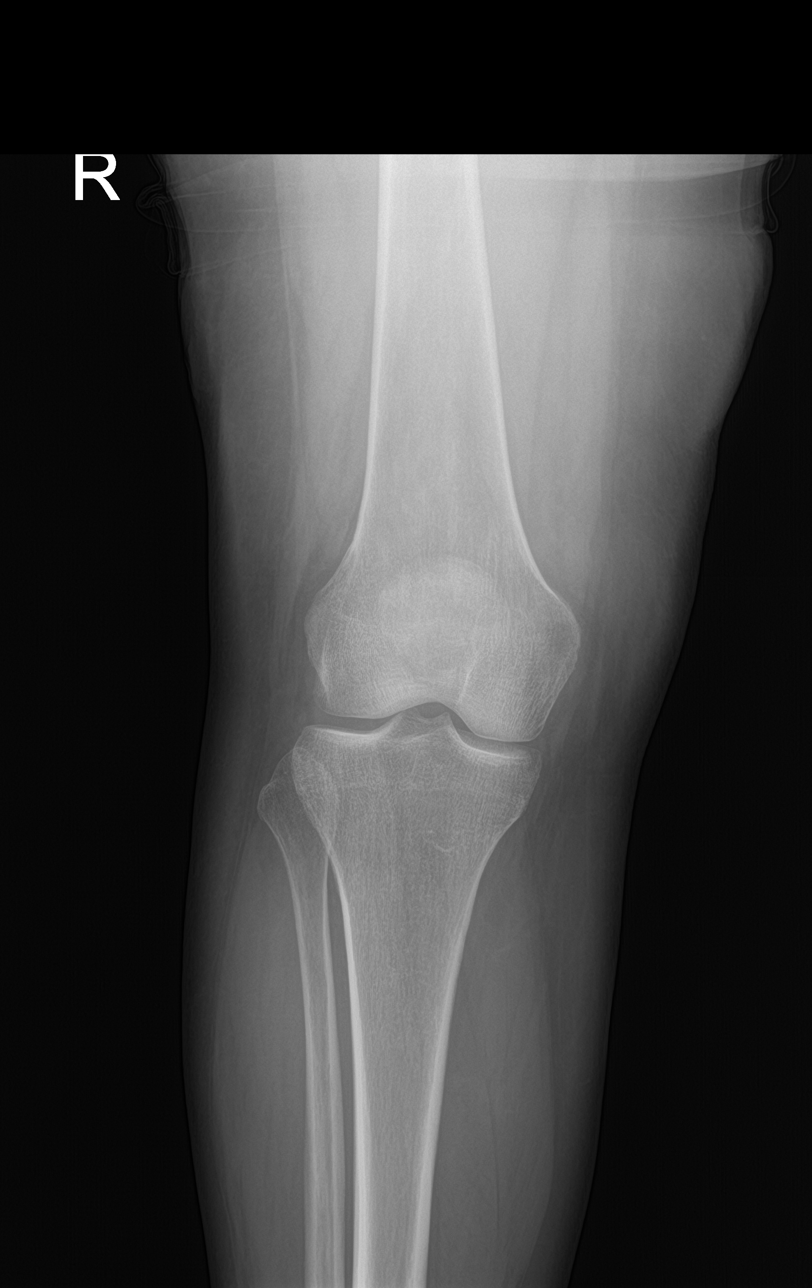

[knee obl (1 of 2)]
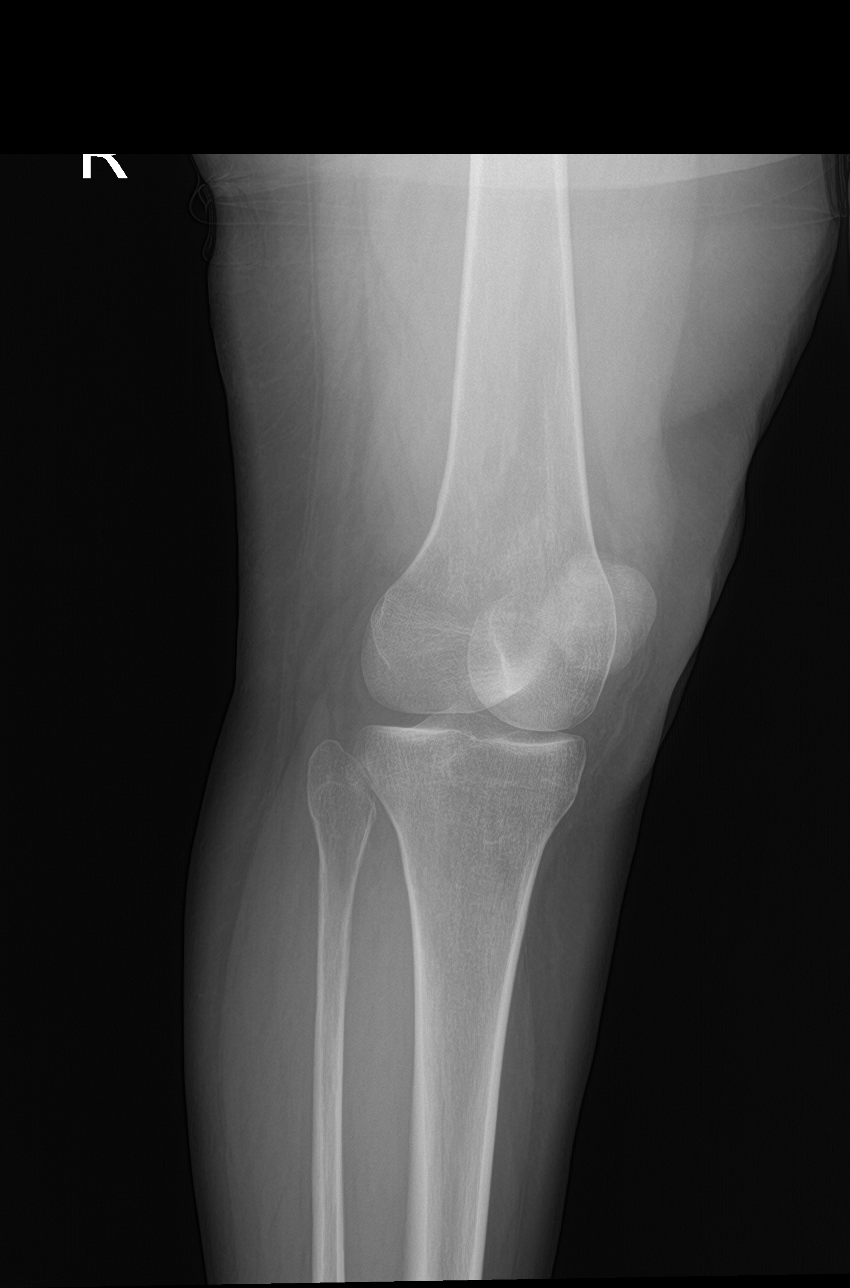

[knee obl (2 of 2)]
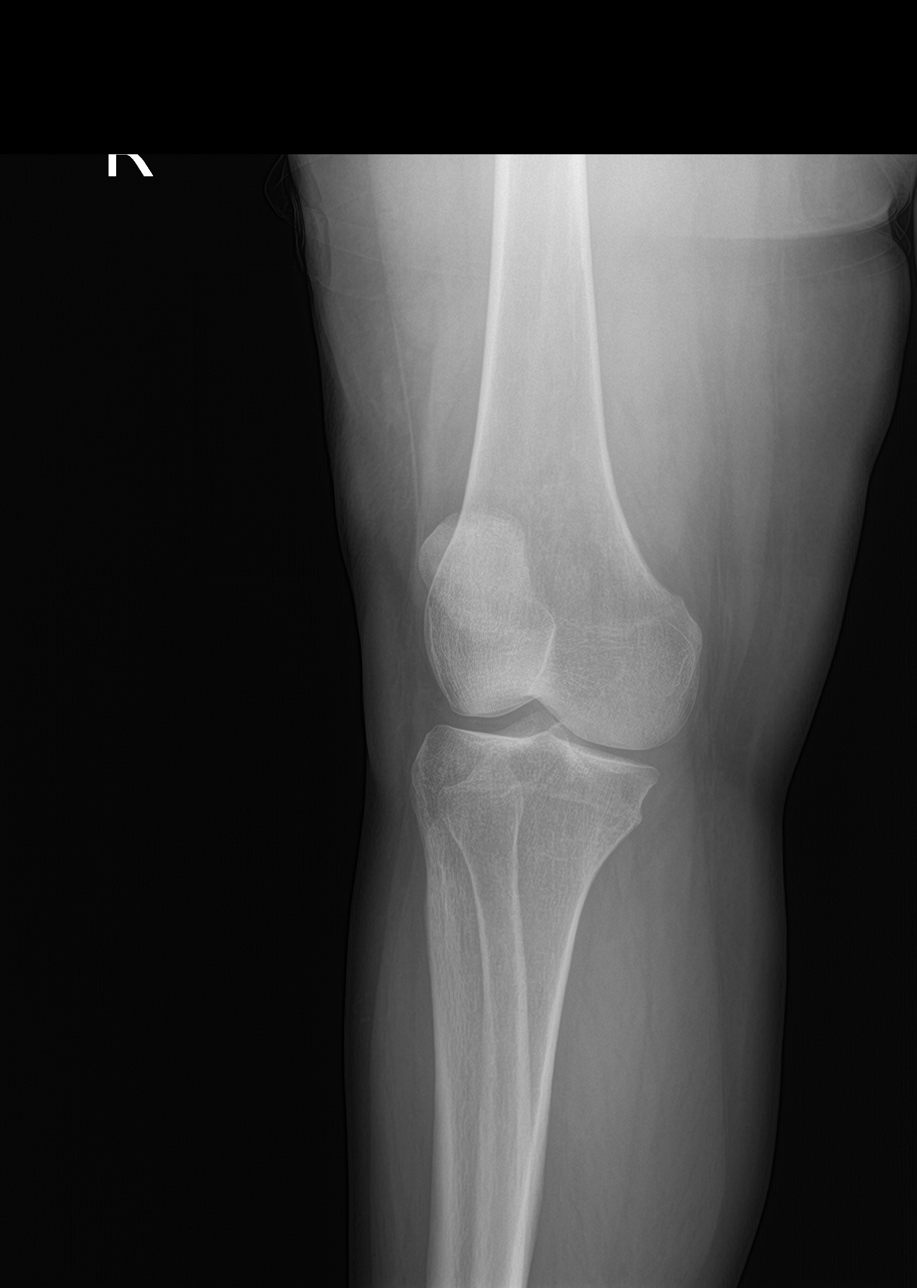

[knee lat]
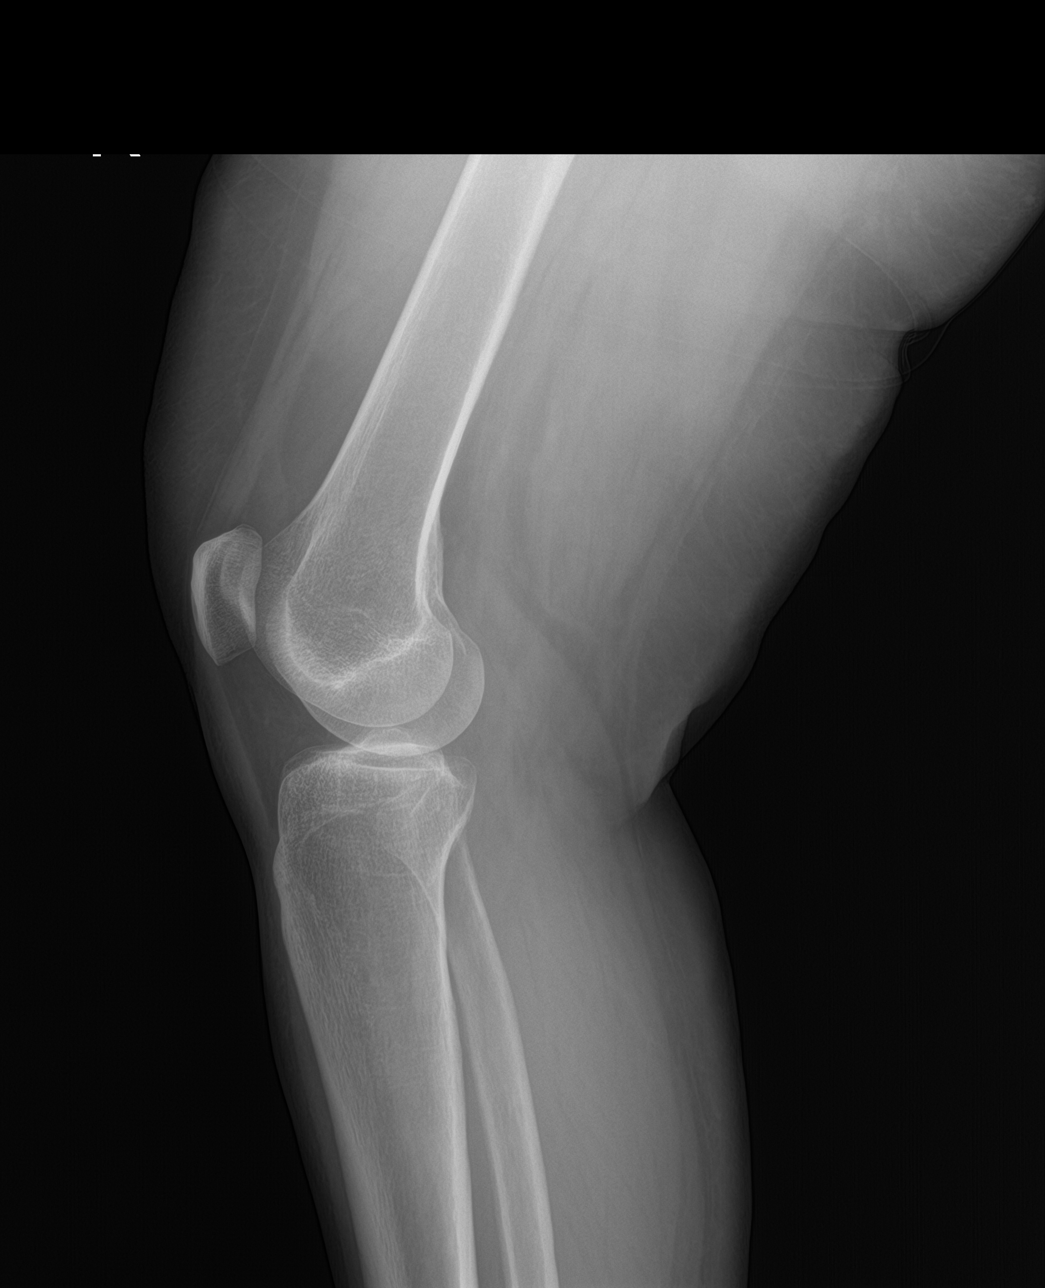

[4 of 4 positions shown; findings below may reference images not displayed]

FINDINGS: Alignment is anatomic. No acute fracture. Joint spaces are
preserved. No joint effusion.
IMPRESSION: Negative.

## 2023-12-27 DIAGNOSIS — K5730 Diverticulosis of large intestine without perforation or abscess without bleeding: Secondary | ICD-10-CM | POA: Insufficient documentation

## 2023-12-27 DIAGNOSIS — K5792 Diverticulitis of intestine, part unspecified, without perforation or abscess without bleeding: Secondary | ICD-10-CM | POA: Insufficient documentation

## 2024-01-13 ENCOUNTER — Ambulatory Visit: Payer: Managed Care, Other (non HMO) | Admitting: Family Medicine

## 2024-01-15 ENCOUNTER — Telehealth: Payer: Self-pay | Admitting: Family Medicine

## 2024-01-15 ENCOUNTER — Ambulatory Visit: Payer: Managed Care, Other (non HMO) | Admitting: Family Medicine

## 2024-01-15 NOTE — Telephone Encounter (Signed)
Called pt to reschedule missed appt; could not reach or leave vm due to full inbox

## 2024-03-23 ENCOUNTER — Encounter (HOSPITAL_COMMUNITY): Payer: Self-pay

## 2024-03-23 ENCOUNTER — Ambulatory Visit (INDEPENDENT_AMBULATORY_CARE_PROVIDER_SITE_OTHER)

## 2024-03-23 ENCOUNTER — Ambulatory Visit (HOSPITAL_COMMUNITY)
Admission: EM | Admit: 2024-03-23 | Discharge: 2024-03-23 | Disposition: A | Discharge status: Home / Self Care | Attending: Emergency Medicine | Admitting: Emergency Medicine

## 2024-03-23 DIAGNOSIS — M25531 Pain in right wrist: Secondary | ICD-10-CM

## 2024-03-23 MED ORDER — PREDNISONE 20 MG PO TABS: 40.0000 mg | ORAL_TABLET | Freq: Every day | ORAL | 0 refills | Status: AC

## 2024-03-23 NOTE — Discharge Instructions (Addendum)
 I will call you if the radiologist sees anything abnormal on x-ray.  Rest - try to avoid heavy lifting and high impact activity Ice - apply for 20 minutes a few times daily Compression - use ace wrap for support Elevation - prop up on a pillow  Prednisone -- 2 tablets in the morning for 5 days  Please follow up with orthopedics -- call to make appointment

## 2024-03-23 NOTE — ED Provider Notes (Signed)
 [facility]    CSN: [id number] Arrival date & time: 03/23/24  1130      History   Chief Complaint Chief Complaint  Patient presents with   Wrist Pain    HPI Debbie Ball is a 58 y.o. female.  [place]are[place] Seat about 1 month ago, landed on right wrist. Has been having increasing pain in the wrist since. Now rating 7/10  Concerned for swelling on and off Not having numbness/tingling  Took old meloxicam prescription. No other intervention attempted. 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 Authorizing Provider  predniSONE (DELTASONE) 20 MG tablet Take 2 tablets (40 mg total) by mouth daily with breakfast for 5 days. 03/23/24 03/28/24 Yes Roderick Sweezy, Lurena Joiner, PA-C  acetaminophen (TYLENOL) 500 MG tablet Take 500 mg by mouth every 6 (six) hours as needed for mild pain.    [provider]  atenolol (TENORMIN) 25 MG tablet Take 1 tablet (25 mg total) by mouth daily. 07/09/23 08/08/23  Georganna Skeans, MD  atenolol (TENORMIN) 25 MG tablet Take 25 mg by mouth daily.    [provider]  cetirizine (ZYRTEC ALLERGY) 10 MG tablet Take 1 tablet (10 mg total) by mouth at bedtime. 11/03/21 02/01/22  Theadora Rama Scales, PA-C  diclofenac Sodium  (VOLTAREN) 1 % GEL Apply 4 g topically 4 (four) times daily. 05/24/22   Bing Neighbors, NP  erythromycin ophthalmic ointment Place a 1/2 inch ribbon of ointment into the lower eyelid 4 times daily for 7 days. 06/17/22   Gustavus Bryant, FNP  fluticasone (FLONASE) 50 MCG/ACT nasal spray Place 2 sprays into both nostrils daily. 11/03/21 02/01/22  Theadora Rama Scales, PA-C  hydrochlorothiazide (HYDRODIURIL) 25 MG tablet Take 1 tablet (25 mg total) by mouth daily. 04/04/23   Piontek, Denny Peon, MD  ipratropium (ATROVENT) 0.06 % nasal spray Place 2 sprays into both nostrils 4 (four) times daily. 11/03/21 12/25/21  Theadora Rama Scales, PA-C  Multiple Vitamins-Minerals (MULTIVITAMIN WITH MINERALS) tablet Take 1 tablet by mouth daily.    [provider]  polyethylene glycol powder (MIRALAX) 17 GM/SCOOP powder Take 1 Container by mouth daily.    [provider]  Psyllium (FIBER) 28.3 % POWD as directed Orally    [provider]    Family History Family History  Problem Relation Age of Onset   Hypertension Father    Hypertension Sister    Other Neg  Hx     Social History Social History   Tobacco Use   Smoking status: Every Day    Current packs/day: 0.50    Average packs/day: 0.5 packs/day for 15.0 years (7.5 ttl pk-yrs)    Types: Cigarettes   Smokeless tobacco: Never  Substance Use Topics   Alcohol use: Yes    Comment: Socially    Drug use: No     Allergies   Patient has no known allergies.   Review of Systems Review of Systems Per HPI  Physical Exam Triage Vital Signs ED Triage Vitals  Encounter Vitals Group     BP 03/23/24 1155 (!) 142/88     Systolic BP Percentile --      Diastolic BP Percentile --      Pulse Rate 03/23/24 1155 80     Resp 03/23/24 1155 18     Temp 03/23/24 1155 99.9 F (37.7 C)     Temp Source 03/23/24 1155 Oral     SpO2 03/23/24 1155 96 %     Weight --      Height --      Head Circumference --      Peak Flow --      Pain  Score 03/23/24 1156 7     Pain Loc --      Pain Education --      Exclude from Growth Chart --    No data found.  Updated Vital Signs BP (!) 142/88 (BP Location: Right Arm)   Pulse 80   Temp 99.9 F (37.7 C) (Oral)   Resp 18   LMP 03/17/2020   SpO2 96%   Physical Exam Vitals and nursing note reviewed.  Constitutional:      General: She is not in acute distress. HENT:     Mouth/Throat:     Pharynx: Oropharynx is clear.  Cardiovascular:     Rate and Rhythm: Normal rate and regular rhythm.     Pulses: Normal pulses.          Radial pulses are 2+ on the right side.     Heart sounds: Normal heart sounds.  Pulmonary:     Effort: Pulmonary effort is normal.     Breath sounds: Normal breath sounds.  Musculoskeletal:     Right wrist: Swelling and tenderness present. No deformity, bony tenderness or snuff box tenderness. Decreased range of motion. Normal pulse.     Cervical back: Normal range of motion.     Comments: Right wrist soft tissue tenderness, pain with movement. Good ROM despite pain. Dorsal swelling noted. No obvious deformity or bony tenderness. Grip strength 3/5. Distal sensation intact, radial pulse 2+, cap refill < 2 seconds  Skin:    Capillary Refill: Capillary refill takes less than 2 seconds.  Neurological:     Mental Status: She is alert and oriented to person, place, and time.    UC Treatments / Results  Labs (all labs ordered are listed, but only abnormal results are displayed) Labs Reviewed - No data to display  EKG   Radiology DG Wrist Complete Right Result Date: 03/23/2024 CLINICAL DATA:  Fall 1 month ago with increasing pain EXAM: RIGHT WRIST - COMPLETE 3+ VIEW COMPARISON:  Right hand x-ray 10/28/2011 FINDINGS: There is no evidence of fracture or dislocation. There is no evidence of arthropathy or other focal bone abnormality. Soft tissues are unremarkable. If there is continued pain or concern additional cross-sectional study could be considered as  clinically appropriate to further assess  for subtle bony or soft tissue injury IMPRESSION: No acute osseous abnormality Electronically Signed   By: Karen Kays M.D.   On: 03/23/2024 15:10    Procedures Procedures (including critical care time)  Medications Ordered in UC Medications - No data to display  Initial Impression / Assessment and Plan / UC Course  I have reviewed the triage vital signs and the nursing notes.  Pertinent labs & imaging results that were available during my care of the patient were reviewed by me and considered in my medical decision making (see chart for details).  Right wrist xray without acute finding. Images independently reviewed by me, agree with radiology interpretation. Wrist brace provided, however patient uncomfortable and it does not fit her well. I have attempted to cancel this order in the system, but am unable to. RN was notified and will be cancelling the brace charge in the [facility] system. No charge for wrist brace.  Instead I have applied an ace wrap which is much more comfortable. We discussed RICE therapy and pain control.  Patient has been told to avoid NSAIDs due to history of diverticulitis.  She has taken meloxicam in the past but does not seem to help well.  Will go ahead and try prednisone burst.  Advise close follow-up with orthopedics when able.  A note for work is provided  Final Clinical Impressions(s) / UC Diagnoses   Final diagnoses:  Right wrist pain     Discharge Instructions      I will call you if the radiologist sees anything abnormal on x-ray.  Rest - try to avoid heavy lifting and high impact activity Ice - apply for 20 minutes a few times daily Compression - use ace wrap for support Elevation - prop up on a pillow  Prednisone -- 2 tablets in the morning for 5 days  Please follow up with orthopedics -- call to make appointment      ED Prescriptions     Medication Sig Dispense Auth. Provider   predniSONE  (DELTASONE) 20 MG tablet Take 2 tablets (40 mg total) by mouth daily with breakfast for 5 days. 10 tablet Dainelle Hun, Lurena Joiner, PA-C      PDMP not reviewed this encounter.   Marlow Baars, [place] 03/23/24 1717

## 2024-03-23 NOTE — ED Triage Notes (Signed)
 Pt states fell at work around a month ago landing on lt knee and rt hand. C/o rt wrist pain that is getting worse.

## 2024-04-23 ENCOUNTER — Emergency Department (HOSPITAL_COMMUNITY)

## 2024-04-23 ENCOUNTER — Emergency Department (HOSPITAL_COMMUNITY)
Admission: EM | Admit: 2024-04-23 | Discharge: 2024-04-23 | Disposition: A | Discharge status: Home / Self Care | Attending: Emergency Medicine | Admitting: Emergency Medicine

## 2024-04-23 ENCOUNTER — Other Ambulatory Visit: Payer: Self-pay

## 2024-04-23 DIAGNOSIS — M545 Low back pain, unspecified: Secondary | ICD-10-CM | POA: Diagnosis present

## 2024-04-23 DIAGNOSIS — R202 Paresthesia of skin: Secondary | ICD-10-CM | POA: Insufficient documentation

## 2024-04-23 DIAGNOSIS — M5431 Sciatica, right side: Secondary | ICD-10-CM

## 2024-04-23 DIAGNOSIS — R7989 Other specified abnormal findings of blood chemistry: Secondary | ICD-10-CM

## 2024-04-23 DIAGNOSIS — M79641 Pain in right hand: Secondary | ICD-10-CM

## 2024-04-23 LAB — CBC WITH DIFFERENTIAL/PLATELET
Abs Immature Granulocytes: 0.02 10*3/uL (ref 0.00–0.07)
Basophils Absolute: 0.1 10*3/uL (ref 0.0–0.1)
Basophils Relative: 1 %
Eosinophils Absolute: 1 10*3/uL — ABNORMAL HIGH (ref 0.0–0.5)
Eosinophils Relative: 12 %
HCT: 39.1 % (ref 36.0–46.0)
Hemoglobin: 12.5 g/dL (ref 12.0–15.0)
Immature Granulocytes: 0 %
Lymphocytes Relative: 23 %
Lymphs Abs: 1.9 10*3/uL (ref 0.7–4.0)
MCH: 32.5 pg (ref 26.0–34.0)
MCHC: 32 g/dL (ref 30.0–36.0)
MCV: 101.6 fL — ABNORMAL HIGH (ref 80.0–100.0)
Monocytes Absolute: 0.6 10*3/uL (ref 0.1–1.0)
Monocytes Relative: 7 %
Neutro Abs: 4.8 10*3/uL (ref 1.7–7.7)
Neutrophils Relative %: 57 %
Platelets: 212 10*3/uL (ref 150–400)
RBC: 3.85 MIL/uL — ABNORMAL LOW (ref 3.87–5.11)
RDW: 13.4 % (ref 11.5–15.5)
WBC: 8.4 10*3/uL (ref 4.0–10.5)
nRBC: 0 % (ref 0.0–0.2)

## 2024-04-23 LAB — COMPREHENSIVE METABOLIC PANEL WITH GFR
ALT: 9 U/L (ref 0–44)
AST: 13 U/L — ABNORMAL LOW (ref 15–41)
Albumin: 3.7 g/dL (ref 3.5–5.0)
Alkaline Phosphatase: 65 U/L (ref 38–126)
Anion gap: 13 (ref 5–15)
BUN: 20 mg/dL (ref 6–20)
CO2: 24 mmol/L (ref 22–32)
Calcium: 9.5 mg/dL (ref 8.9–10.3)
Chloride: 102 mmol/L (ref 98–111)
Creatinine, Ser: 1.31 mg/dL — ABNORMAL HIGH (ref 0.44–1.00)
GFR, Estimated: 48 mL/min — ABNORMAL LOW (ref >60)
Glucose, Bld: 134 mg/dL — ABNORMAL HIGH (ref 70–99)
Potassium: 3.5 mmol/L (ref 3.5–5.1)
Sodium: 139 mmol/L (ref 135–145)
Total Bilirubin: 0.5 mg/dL (ref 0.0–1.2)
Total Protein: 7.5 g/dL (ref 6.5–8.1)

## 2024-04-23 MED ORDER — NAPROXEN 375 MG PO TABS: 375.0000 mg | ORAL_TABLET | Freq: Two times a day (BID) | ORAL | 0 refills | Status: DC

## 2024-04-23 MED ORDER — DEXAMETHASONE SODIUM PHOSPHATE 10 MG/ML IJ SOLN
10.0000 mg | Freq: Once | INTRAMUSCULAR | Status: AC
Start: 2024-04-23 — End: 2024-04-23
  Administered 2024-04-23: 10 mg via INTRAMUSCULAR
  Filled 2024-04-23: qty 1

## 2024-04-23 MED ORDER — METHOCARBAMOL 500 MG PO TABS: 500.0000 mg | ORAL_TABLET | Freq: Three times a day (TID) | ORAL | 0 refills | Status: DC | PRN

## 2024-04-23 MED ORDER — KETOROLAC TROMETHAMINE 60 MG/2ML IM SOLN
60.0000 mg | Freq: Once | INTRAMUSCULAR | Status: AC
  Administered 2024-04-23: 60 mg via INTRAMUSCULAR
  Filled 2024-04-23: qty 2

## 2024-04-23 MED ORDER — METHYLPREDNISOLONE 4 MG PO TBPK: ORAL_TABLET | ORAL | 0 refills | Status: DC

## 2024-04-23 NOTE — ED Triage Notes (Signed)
 Patient reports tingling in bilateral hands and bilateral feet x 2 weeks. Also states R hip pain radiating down leg that is worse when she drives. Taking OTC meds without relief. Has tried some topical analgesics with temporary relief.

## 2024-04-23 NOTE — Discharge Instructions (Addendum)
 Your had xrays were negative.  Your serum creatinine was just above normal but higher than it was a year ago.  This can be due to dehydration and will need to be repeated at your primary care office.  Increase your fluid intake. It appears that you are having sciatica. I am discharging you with treatment START the MEDROL  PACK TOMORROW! You may take the other medications starting today. SEEK IMMEDIATE MEDICAL ATTENTION IF: New numbness, tingling, weakness, or problem with the use of your arms or legs.  Severe back pain not relieved with medications.  Change in bowel or bladder control.  Increasing pain in any areas of the body (such as chest or abdominal pain).  Shortness of breath, dizziness or fainting.  Nausea (feeling sick to your stomach), vomiting, fever, or sweats.

## 2024-04-23 NOTE — ED Provider Notes (Signed)
 [place] [facility] Provider Note   CSN: [id number] Arrival date & time: 04/23/24  7829     History  Chief Complaint  Patient presents with   Tingling   Leg Pain    Debbie Ball is a 58 y.o. female who presents the emergency department with chief complaint of sciatica and hand pain.  Patient reports a history of sciatica.  She is complaining of pain in her right low back gluteus down the back of her right leg which is worse when sitting for long periods of time, and changing position.  She denies any red flag symptoms.  She denies numbness and tingling in her feet but complains of bilateral plantar surface pain.  She denies calf swelling chest pain or shortness of breath.  She is also complaining of bilateral pain in the joints of her hands.  She denies numbness or tingling.  She denies weakness but states that sometimes her hands hurt so badly she had difficulty opening jars.  She has no significant neck pain.  All of her symptoms are worse in the morning and improve over time   Leg Pain      Home Medications Prior to Admission medications   Medication Sig Start Date End Date Taking? Authorizing Provider  acetaminophen  (TYLENOL ) 500 MG tablet Take 500 mg by mouth every 6 (six) hours as needed for mild pain.    [provider]  atenolol  (TENORMIN ) 25 MG tablet Take 1 tablet (25 mg total) by mouth daily. 07/09/23 08/08/23  Abraham Abo, MD  atenolol  (TENORMIN ) 25 MG tablet Take 25 mg by mouth daily.    [provider]  cetirizine  (ZYRTEC  ALLERGY) 10 MG tablet Take 1 tablet (10 mg total) by mouth at bedtime. 11/03/21 02/01/22  Eloise Hake Scales, PA-C  diclofenac  Sodium (VOLTAREN ) 1 % GEL Apply 4 g topically 4 (four) times daily. 05/24/22   Buena Carmine, NP  erythromycin  ophthalmic ointment Place a 1/2 inch ribbon of ointment into the lower eyelid 4 times daily for 7 days. 06/17/22   Dodson Freestone, FNP  fluticasone  (FLONASE )  50 MCG/ACT nasal spray Place 2 sprays into both nostrils daily. 11/03/21 02/01/22  Eloise Hake Scales, PA-C  hydrochlorothiazide  (HYDRODIURIL ) 25 MG tablet Take 1 tablet (25 mg total) by mouth daily. 04/04/23   Piontek, [place] Dales, MD  ipratropium (ATROVENT ) 0.06 % nasal spray Place 2 sprays into both nostrils 4 (four) times daily. 11/03/21 12/25/21  Eloise Hake Scales, PA-C  Multiple Vitamins-Minerals (MULTIVITAMIN WITH MINERALS) tablet Take 1 tablet by mouth daily.    [provider]  polyethylene glycol powder (MIRALAX ) 17 GM/SCOOP powder Take 1 Container by mouth daily.    [provider]  Psyllium (FIBER) 28.3 % POWD as directed Orally    [provider]      Allergies    Patient has no known allergies.    Review of Systems   Review of Systems  Physical Exam Updated Vital Signs BP 125/65   Pulse 64   Temp 98 F (36.7 C) (Oral)   Resp 17   LMP 03/17/2020   SpO2 100%  Physical Exam Vitals and nursing note reviewed.  Constitutional:      General: She is not in acute distress.    Appearance: She is well-developed. She is not diaphoretic.  HENT:     Head: Normocephalic and atraumatic.     Right Ear: External ear normal.     Left Ear: External ear normal.  Nose: Nose normal.     Mouth/Throat:     Mouth: Mucous membranes are moist.  Eyes:     General: No scleral icterus.    Conjunctiva/sclera: Conjunctivae normal.  Cardiovascular:     Rate and Rhythm: Normal rate and regular rhythm.     Heart sounds: Normal heart sounds. No murmur heard.    No friction rub. No gallop.  Pulmonary:     Effort: Pulmonary effort is normal. No respiratory distress.     Breath sounds: Normal breath sounds.  Abdominal:     General: Bowel sounds are normal. There is no distension.     Palpations: Abdomen is soft. There is no mass.     Tenderness: There is no abdominal tenderness. There is no guarding.  Musculoskeletal:     Cervical back: Normal range of motion.      Comments: Lumbosacral spine area reveals no midline tenderness and no spasm.  Painful and reduced LS ROM noted. Straight leg raise is positive at 35 degrees on right. DTR's, motor strength and sensation normal, including heel and toe gait.  Peripheral pulses are palpable. Hips and knees have full range of motion without pain. No abdominal tenderness, mass or organomegaly.  Legs are equal in size without any unilateral swelling.  DP PT pulse 2+ with normal sensation.  Plantar surface of both feet shows no swelling.  She does have a small corn on the sole of her right foot.  No obvious signs of infection.  Normal ipsilateral knee and ankle examination. Bilateral hands with out swelling.  Normal pulse and sensation.  Full range of motion and all motor and sensory nerves are intact.  Normal strength and equal bilateral grips  Skin:    General: Skin is warm and dry.  Neurological:     Mental Status: She is alert and oriented to person, place, and time.  Psychiatric:        Behavior: Behavior normal.     ED Results / Procedures / Treatments   Labs (all labs ordered are listed, but only abnormal results are displayed) Labs Reviewed  CBC WITH DIFFERENTIAL/PLATELET  COMPREHENSIVE METABOLIC PANEL WITH GFR    EKG None  Radiology No results found.  Procedures Procedures    Medications Ordered in ED Medications - No data to display  ED Course/ Medical Decision Making/ A&P Clinical Course as of 04/23/24 1510  Fri Apr 23, 2024  1503 Comprehensive metabolic panel(!) [AH]  1503 CBC with Differential(!) [AH]  1509 Creatinine(!): 1.31 [AH]    Clinical Course User Index [AH] Tama Fails, PA-C                                 Medical Decision Making Patient with complaint of sciatica and hand pain.  She denies any numbness tingling or weakness in her extremities.  To be fair.  Patient is a bit of a poor historian has some difficulty describing her symptoms however after thorough  investigation she denies any form of neuropathic numbness or tingling.  She does have sciatic symptoms in the right leg no evidence of DVT, no concern for deep space infection, necrotizing fasciitis or compartment syndrome. I reviewed the patient's labs which show no significant abnormality.  She has a little bit of an elevated MCV without anemia.  Blood sugar 134 today her creatinine was a bit more elevated but last value was from a year ago.  She will need to  push fluids and have a repeat in the outpatient setting.  Discussed outpatient follow-up and return precautions, no red flag symptoms.  I visualized and interpreted bilateral hand x-rays which are negative for acute finding.  She does not have any signs or symptoms of myelopathy or radiculopathy  Amount and/or Complexity of Data Reviewed Labs: ordered. Decision-making details documented in ED Course. Radiology: ordered and independent interpretation performed.  Risk Prescription drug management.           Final Clinical Impression(s) / ED Diagnoses Final diagnoses:  None    Rx / DC Orders ED Discharge Orders     None         Tama Fails, PA-C 04/23/24 1511    Lowery Rue, DO 04/23/24 [phone]

## 2024-04-30 ENCOUNTER — Ambulatory Visit: Payer: Self-pay

## 2024-04-30 NOTE — Telephone Encounter (Signed)
 Call to patient unable to reach and able to leave message VM not set-up

## 2024-04-30 NOTE — Telephone Encounter (Signed)
  Chief Complaint: back and leg pain Symptoms: pain/ numbness - limited mobility Frequency: 2 weeks Pertinent Negatives: Patient denies bowel or bladder issues Disposition: [] ED /[] Urgent Care (no appt availability in office) / [x] Appointment(In office/virtual)/ []  [facility]/ [] Home Care/ [] Refused Recommended Disposition /[] [facility]/ []  Follow-up with PCP Additional Notes: Pt was seen in ED for sciatic pain. Pt was given medication and released. Pt states that pain is no better. Pt states that leg is numb, and painful. No appts available in office in time frame per protocol. Pt does not want to go to ED or UC. Please advise.    Copied from CRM [id number]. Topic: Clinical - Red Word Triage >> Apr 30, 2024  9:57 AM Stanly Early wrote: Patient went to the er for sciatica nerve. She stated nothing is helping with the pain. Reason for Disposition  Numbness in a leg or foot (i.e., loss of sensation)  Answer Assessment - Initial Assessment Questions 1. ONSET: "When did the pain begin?"      About 2 weeks - but has had sciatica in the past 2. LOCATION: "Where does it hurt?" (upper, mid or lower back)     Right lower back - shoots done right leg 3. SEVERITY: "How bad is the pain?"  (e.g., Scale 1-10; mild, moderate, or severe)   - MILD (1-3): Doesn't interfere with normal activities.    - MODERATE (4-7): Interferes with normal activities or awakens from sleep.    - SEVERE (8-10): Excruciating pain, unable to do any normal activities.      severe 4. PATTERN: "Is the pain constant?" (e.g., yes, no; constant, intermittent)      constant 5. RADIATION: "Does the pain shoot into your legs or somewhere else?"     Down right leg 6. CAUSE:  "What do you think is causing the back pain?"      Sciatica  8. MEDICINES: "What have you taken so far for the pain?" (e.g., nothing, acetaminophen , NSAIDS)     Steroids, pain medication 9. NEUROLOGIC SYMPTOMS: "Do you have any weakness,  numbness, or problems with bowel/bladder control?"     Numbness  Protocols used: Back Pain-A-AH

## 2024-05-06 NOTE — Telephone Encounter (Signed)
 Follow-up with patient, unable to reach VM left. Patient has appointment with [facility]  05/07/2024.

## 2024-05-07 ENCOUNTER — Other Ambulatory Visit (INDEPENDENT_AMBULATORY_CARE_PROVIDER_SITE_OTHER): Payer: Self-pay

## 2024-05-07 ENCOUNTER — Encounter: Payer: Self-pay | Admitting: Physician Assistant

## 2024-05-07 ENCOUNTER — Ambulatory Visit: Admitting: Physician Assistant

## 2024-05-07 ENCOUNTER — Telehealth: Payer: Self-pay | Admitting: Physician Assistant

## 2024-05-07 ENCOUNTER — Other Ambulatory Visit: Payer: Self-pay | Admitting: Physician Assistant

## 2024-05-07 ENCOUNTER — Other Ambulatory Visit

## 2024-05-07 DIAGNOSIS — G8929 Other chronic pain: Secondary | ICD-10-CM | POA: Diagnosis not present

## 2024-05-07 DIAGNOSIS — M5441 Lumbago with sciatica, right side: Secondary | ICD-10-CM

## 2024-05-07 MED ORDER — MELOXICAM 7.5 MG PO TABS: 7.5000 mg | ORAL_TABLET | Freq: Every day | ORAL | 0 refills | Status: AC

## 2024-05-07 NOTE — Telephone Encounter (Signed)
 Pt called stating she had an appt with Kevon Pellegrini and she was to send in a script to pharmacy. Please send medication to pharmacy on file. Pt phone number is [phone].

## 2024-05-07 NOTE — Progress Notes (Signed)
 Office Visit Note   Patient: Debbie Ball           Date of Birth: 1966-12-15           MRN: [id number] Visit Date: 05/07/2024              Requested by: Abraham Abo, MD [address] [place],  [place] [address] PCP: Abraham Abo, MD   Assessment & Plan: Visit Diagnoses:  1. Chronic right-sided low back pain with right-sided sciatica     Plan: Debbie Ball is a very pleasant 58 year old woman with a chief complaint of 3 to 4 weeks of severe right sided low back pain radiating into her leg.  She has trouble going from a sitting to standing position.  She is barely able to ambulate.  She did go to the emergency room for this a couple weeks ago.  She was given a Toradol  shot steroid shot and placed on a steroid taper.  She has not gotten any relief from any of this.  She cannot really take anti-inflammatories because it gave her heart palpitations.  She said her leg is numb and she is obviously weak.  She has a strongly positive straight leg raise.  I am concerned for her herniated disc.  Her x-rays do not show anything acute.  Because of her mechanical symptoms her weakness and her no improvement with several different medications I would like to get an MRI of her lumbar spine as soon as possible.  Follow-Up Instructions: No follow-ups on file.   Orders:  Orders Placed This Encounter  Procedures   XR Lumbar Spine 2-3 Views   No orders of the defined types were placed in this encounter.     Procedures: No procedures performed   Clinical Data: No additional findings.   Subjective: No chief complaint on file.   HPI Patient is a pleasant 58 year old woman comes in today with right leg pain.  She states she went to the emergency department about 2 weeks ago.  Pain goes from the right lower back radiating down the leg with some numbness.  It hurts to get in and out of the car.  She was given a Toradol  injection but has not been helpful denies any loss of bowel or bladder  control.  She was also given a steroid injection and placed on a steroid taper without any relief.  She is also taking a muscle relaxant which has not provided any relief.  She does not like to take pain medication because of its side effects.  She cannot take anti-inflammatories  Review of Systems  All other systems reviewed and are negative.    Objective: Vital Signs: LMP 03/17/2020   Physical Exam Constitutional:      Appearance: Normal appearance.  Pulmonary:     Effort: Pulmonary effort is normal.  Skin:    General: Skin is warm and dry.  Neurological:     Mental Status: She is alert.  Psychiatric:        Mood and Affect: Mood normal.        Behavior: Behavior normal.     Ortho Exam Examination she is very slow to ambulate she has significant difficulty sitting and standing and transitions.  She has some altered sensation in her foot but neurovascularly she is intact.  She has good dorsiflexion plantarflexion but has very little strength with extension flexion of her legs and a strongly positive right straight leg raise Specialty Comments:  No specialty comments  available.  Imaging: XR Lumbar Spine 2-3 Views Result Date: 05/07/2024 2 view radiographs of her lumbar spine demonstrate no listhesis.  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   Acute diverticulitis 12/25/2021   Sigmoid diverticulitis 06/05/2019   Hypertension    GERD (gastroesophageal reflux disease)    Tobacco abuse    Hypokalemia    Past Medical History:  Diagnosis Date   GERD (gastroesophageal reflux disease)    Hypertension    Tobacco abuse     Family History  Problem Relation Age of Onset   Hypertension Father    Hypertension Sister    Other Neg Hx     Past Surgical History:  Procedure Laterality Date   INDUCED ABORTION     Social  History   Occupational History   Not on file  Tobacco Use   Smoking status: Every Day    Current packs/day: 0.50    Average packs/day: 0.5 packs/day for 15.0 years (7.5 ttl pk-yrs)    Types: Cigarettes   Smokeless tobacco: Never  Substance and Sexual Activity   Alcohol use: Yes    Comment: Socially    Drug use: No   Sexual activity: Yes

## 2024-05-10 ENCOUNTER — Ambulatory Visit
Admission: RE | Admit: 2024-05-10 | Discharge: 2024-05-10 | Disposition: A | Discharge status: Home / Self Care | Source: Ambulatory Visit | Attending: Physician Assistant | Admitting: Physician Assistant

## 2024-05-10 DIAGNOSIS — G8929 Other chronic pain: Secondary | ICD-10-CM

## 2024-05-18 ENCOUNTER — Ambulatory Visit (INDEPENDENT_AMBULATORY_CARE_PROVIDER_SITE_OTHER): Admitting: Physical Medicine and Rehabilitation

## 2024-05-18 ENCOUNTER — Encounter: Payer: Self-pay | Admitting: Physical Medicine and Rehabilitation

## 2024-05-18 ENCOUNTER — Ambulatory Visit: Admitting: Physician Assistant

## 2024-05-18 DIAGNOSIS — M7918 Myalgia, other site: Secondary | ICD-10-CM | POA: Diagnosis not present

## 2024-05-18 DIAGNOSIS — M5116 Intervertebral disc disorders with radiculopathy, lumbar region: Secondary | ICD-10-CM

## 2024-05-18 DIAGNOSIS — M5441 Lumbago with sciatica, right side: Secondary | ICD-10-CM

## 2024-05-18 DIAGNOSIS — M5416 Radiculopathy, lumbar region: Secondary | ICD-10-CM

## 2024-05-18 DIAGNOSIS — G8929 Other chronic pain: Secondary | ICD-10-CM

## 2024-05-18 MED ORDER — TRAMADOL HCL 50 MG PO TABS: 50.0000 mg | ORAL_TABLET | Freq: Three times a day (TID) | ORAL | 0 refills | Status: DC | PRN

## 2024-05-18 NOTE — Progress Notes (Unsigned)
 Core Outcome Measures Index (COMI) Back Score  Average Pain 8  COMI Score 80%

## 2024-05-18 NOTE — Progress Notes (Unsigned)
 Pain Scale   Average Pain 8 Patient advising her lower back pain is constant and radiating to her right leg with out relief. Patient advising she is here for her Mri Review.        +Driver, -BT, -Dye Allergies.

## 2024-05-18 NOTE — Progress Notes (Unsigned)
 Debbie Ball - 58 y.o. female MRN [id number]  Date of birth: Apr 07, 1966  Office Visit Note: Visit Date: 05/18/2024 PCP: Debbie Abo, MD Referred by: Debbie Abo, MD  Subjective: Chief Complaint  Patient presents with   Lower Back - Pain   HPI: Debbie Ball is a 58 y.o. female who comes in today per the request of Debbie Beckers Persons, PA for evaluation of chronic, worsening and severe right sided buttock pain radiating down right posterolateral leg to foot. Pain ongoing for several years, worsened about 4 weeks ago. Her pain worsens with prolonged sitting, driving causes increased pain. She describes her pain as sore and cramping sensation, currently rates as 7 out of 10. Some relief of pain with home exercise regimen, rest and use of medications. History of formal physical therapy several years ago with minimal relief of pain.  She was evaluated in the emergency department on 04/23/2024. She was prescribed Robaxin , Prednisone  and Naproxen  with minimal relief of pain. She was also evaluated by my colleague Debbie Beckers Persons, PA who prescribed Meloxicam  with little relief of pain. Recent lumbar MRI imaging shows shallow central and slightly left paracentral disc protrusion at L4-L5 with mild impression on the ventral thecal sac. There is mild bilateral lateral recess stenosis at this level, left greater than right. No high grade spinal canal stenosis noted. 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 Patient denies focal weakness, numbness and tingling. No recent trauma or falls.      Review of Systems  Musculoskeletal:  Positive for back pain.  Neurological:  Negative for tingling, sensory change, focal weakness and weakness.  All other systems reviewed and are negative.  Otherwise per HPI.  Assessment & Plan: Visit Diagnoses:    ICD-10-CM   1. Chronic right-sided low back pain with right-sided sciatica  M54.41    G89.29     2.  Right buttock pain  M79.18     3. Lumbar radiculopathy  M54.16     4. Intervertebral disc disorders with radiculopathy, lumbar region  M51.16        Plan: Findings:  Chronic, worsening and severe right-sided buttock pain radiating down posterolateral leg to foot.  Patient continues to have severe pain despite good conservative therapy such as formal physical therapy, home exercise regimen, rest and use of medications.  Patient's clinical presentation and exam are consistent with lumbar radiculopathy, more of L5 and S1 nerve pattern.  There is central and slightly left paracentral disc protrusion at the level of L4-L5 on recent lumbar MRI imaging.  I explained to patient that MRI imaging is not a pain test, however does show us  what her spine looks like from a structural standpoint.  I do believe this disc protrusion at the level of L4-L5 could be a pain generator for her. I also explained to her that typically disc herniations do re-absorb over time. We discussed treatment plan in detail today.  Next step is to perform diagnostic and hopefully therapeutic right L5-S1 interlaminar epidural steroid injection under fluoroscopic guidance.  She is not currently taking anticoagulant medications.  I discussed injection procedure in detail today, she has no questions at this time.  Also discussed medication management.  She can continue with Meloxicam , however I also prescribed tramadol  for her to take for severe discomfort. We will see her back for injection. No red flag symptoms noted upon exam today.     Meds & Orders: No orders of  the defined types were placed in this encounter.  No orders of the defined types were placed in this encounter.   Follow-up: Return for Right L5-S1 interlaminar epidural steroid injection.   Procedures: No procedures performed      Clinical History: Narrative & Impression CLINICAL DATA:  Chronic back pain and right leg pain.  Fell at work.   EXAM: MRI LUMBAR SPINE  WITHOUT CONTRAST   TECHNIQUE: Multiplanar, multisequence MR imaging of the lumbar spine was performed. No intravenous contrast was administered.   COMPARISON:  Radiographs 05/07/2024   FINDINGS: Segmentation: There are five lumbar type vertebral bodies. The last full intervertebral disc space is labeled L5-S1. This correlates with the lumbar radiographs.   Alignment:  Physiologic.   Vertebrae: Mild endplate reactive changes but no bone lesions or fractures. No pars defects.   Conus medullaris and cauda equina: Conus extends to the L1-2 level. Conus and cauda equina appear normal.   Paraspinal and other soft tissues: No significant paraspinal or retroperitoneal findings.   Disc levels:   T12-L1: No significant findings.   L1-2: No significant findings.   L2-3: Mild facet disease and mild annular bulge with slight flattening of the ventral thecal sac but no spinal, lateral recess or foraminal stenosis. Small perineural cyst noted on the right.   L3-4: Mild facet disease and slight bulging annulus with mild bilateral lateral recess encroachment but no significant spinal or foraminal stenosis   L4-5: Shallow central and slightly left paracentral disc protrusion with mild impression on the ventral thecal sac but no significant neural compression. Mild to moderate facet disease and ligamentum flavum thickening contributing to mild bilateral lateral recess stenosis, left greater than right. No significant spinal or foraminal stenosis.   L5-S1: Mild to moderate facet disease and ligamentum flavum thickening but no disc protrusion, spinal or foraminal stenosis. Slightly dilated S1 nerve root sheath.   IMPRESSION: 1. Shallow central and slightly left paracentral disc protrusion at L4-5 with mild impression on the ventral thecal sac but no significant neural compression. Mild to moderate facet disease and ligamentum flavum thickening contributing to mild bilateral  lateral recess stenosis, left greater than right. No significant spinal or foraminal stenosis. 2. Mild bilateral lateral recess encroachment at L3-4.     Electronically Signed   By: Marrian Siva M.D.   On: 05/10/2024 19:19   She reports that she has been smoking cigarettes. She has a 7.5 pack-year smoking history. She has never used smokeless tobacco. No results for input(s): "HGBA1C", "LABURIC" in the last 8760 hours.  Objective:  VS:  HT:    WT:   BMI:     BP:   HR: bpm  TEMP: ( )  RESP:  Physical Exam Vitals and nursing note reviewed.  HENT:     Head: Normocephalic and atraumatic.     Right Ear: External ear normal.     Left Ear: External ear normal.     Nose: Nose normal.     Mouth/Throat:     Mouth: Mucous membranes are moist.  Eyes:     Extraocular Movements: Extraocular movements intact.  Cardiovascular:     Rate and Rhythm: Normal rate.     Pulses: Normal pulses.  Pulmonary:     Effort: Pulmonary effort is normal.  Abdominal:     General: Abdomen is flat. There is no distension.  Musculoskeletal:        General: Tenderness present.     Cervical back: Normal range of motion.  Comments: Patient rises from seated position to standing without difficulty. Good lumbar range of motion. No pain noted with facet loading. 5/5 strength noted with bilateral hip flexion, knee flexion/extension, ankle dorsiflexion/plantarflexion and EHL. No clonus noted bilaterally. No pain upon palpation of greater trochanters. No pain with internal/external rotation of bilateral hips. Sensation intact bilaterally. Dysesthesias noted to right L5 and S1 dermatomes. Negative slump test bilaterally. Ambulates without aid, gait steady.     Skin:    General: Skin is warm and dry.     Capillary Refill: Capillary refill takes less than 2 seconds.  Neurological:     General: No focal deficit present.     Mental Status: She is alert and oriented to person, place, and time.  Psychiatric:         Mood and Affect: Mood normal.        Behavior: Behavior normal.     Ortho Exam  Imaging: No results found.  Past Medical/Family/Surgical/Social History: Medications & Allergies reviewed per EMR, new medications updated. 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   Acute diverticulitis 12/25/2021   Sigmoid diverticulitis 06/05/2019   Hypertension    GERD (gastroesophageal reflux disease)    Tobacco abuse    Hypokalemia    Past Medical History:  Diagnosis Date   GERD (gastroesophageal reflux disease)    Hypertension    Tobacco abuse    Family History  Problem Relation Age of Onset   Hypertension Father    Hypertension Sister    Other Neg Hx    Past Surgical History:  Procedure Laterality Date   INDUCED ABORTION     Social History   Occupational History   Not on file  Tobacco Use   Smoking status: Every Day    Current packs/day: 0.50    Average packs/day: 0.5 packs/day for 15.0 years (7.5 ttl pk-yrs)    Types: Cigarettes   Smokeless tobacco: Never  Substance and Sexual Activity   Alcohol use: Yes    Comment: Socially    Drug use: No   Sexual activity: Yes

## 2024-06-16 ENCOUNTER — Encounter: Admitting: Physical Medicine and Rehabilitation

## 2024-06-28 ENCOUNTER — Other Ambulatory Visit: Payer: Self-pay | Admitting: Family Medicine

## 2024-06-28 NOTE — Telephone Encounter (Unsigned)
 Copied from CRM [id number]. Topic: Clinical - Medication Refill >> Jun 28, 2024 11:27 AM Carlatta H wrote: Medication: atenolol  (TENORMIN ) 25 MG tablet hydrochlorothiazide  (HYDRODIURIL ) 25 MG tablet  Has the patient contacted their pharmacy? No (Agent: If no, request that the patient contact the pharmacy for the refill. If patient does not wish to contact the pharmacy document the reason why and proceed with request.) (Agent: If yes, when and what did the pharmacy advise?)  This is the patient's preferred pharmacy:    [facility] [address] ([address]), [place] - [address] [place] ([place]) [place] [address] Phone: [phone] Fax: [phone]  Is this the correct pharmacy for this prescription? No If no, delete pharmacy and type the correct one.   Has the prescription been filled recently? No  Is the patient out of the medication? Yes  Has the patient been seen for an appointment in the last year OR does the patient have an upcoming appointment? Yes  Can we respond through MyChart? Yes  Agent: Please be advised that Rx refills may take up to 3 business days. We ask that you follow-up with your pharmacy.

## 2024-06-30 NOTE — Telephone Encounter (Signed)
 Requested medication (s) are due for refill today: expired medication date - tenormin   Requested medication (s) are on the active medication list: yes   Last refill:  tenormin - 07/09/23 - 08/08/23 # 90 1 refills, hydrodiuril - 04/04/23 #30 0 refills  Future visit scheduled: yes tomorrow  Notes to clinic:  last OV 11 months ago , expired medication date- tenormin , hydrodiuril  last ordered by Rocky Stabile, MD 04/04/23. Do you wan to refill/ order Rxs?     Requested Prescriptions  Pending Prescriptions Disp Refills   atenolol  (TENORMIN ) 25 MG tablet 90 tablet 1    Sig: Take 1 tablet (25 mg total) by mouth daily.     Cardiovascular: Beta Blockers 2 Failed - 06/30/2024  1:09 PM      Failed - Cr in normal range and within 360 days    Creatinine, Ser  Date Value Ref Range Status  04/23/2024 1.31 (H) 0.44 - 1.00 mg/dL Final         Failed - Valid encounter within last 6 months    Recent Outpatient Visits           11 months ago Essential hypertension   [facility], MD   1 year ago Uncontrolled hypertension   [facility], Rag[place], MD              Passed - Last BP in normal range    BP Readings from Last 1 Encounters:  04/23/24 99/64         Passed - Last Heart Rate in normal range    Pulse Readings from Last 1 Encounters:  04/23/24 (!) 56          hydrochlorothiazide  (HYDRODIURIL ) 25 MG tablet 30 tablet 0    Sig: Take 1 tablet (25 mg total) by mouth daily.     Cardiovascular: Diuretics - Thiazide Failed - 06/30/2024  1:09 PM      Failed - Cr in normal range and within 180 days    Creatinine, Ser  Date Value Ref Range Status  04/23/2024 1.31 (H) 0.44 - 1.00 mg/dL Final         Failed - Valid encounter within last 6 months    Recent Outpatient Visits           11 months ago Essential hypertension   [facility], Raguel, MD   1 year ago Uncontrolled  hypertension   [facility], Raguel, MD              Passed - K in normal range and within 180 days    Potassium  Date Value Ref Range Status  04/23/2024 3.5 3.5 - 5.1 mmol/L Final         Passed - Na in normal range and within 180 days    Sodium  Date Value Ref Range Status  04/23/2024 139 135 - 145 mmol/L Final         Passed - Last BP in normal range    BP Readings from Last 1 Encounters:  04/23/24 99/64

## 2024-07-01 ENCOUNTER — Encounter: Payer: Self-pay | Admitting: Family Medicine

## 2024-07-01 ENCOUNTER — Ambulatory Visit (INDEPENDENT_AMBULATORY_CARE_PROVIDER_SITE_OTHER): Admitting: Family Medicine

## 2024-07-01 VITALS — BP 136/87 | HR 93 | Ht 66.0 in | Wt 216.0 lb

## 2024-07-01 DIAGNOSIS — Z720 Tobacco use: Secondary | ICD-10-CM

## 2024-07-01 DIAGNOSIS — M5430 Sciatica, unspecified side: Secondary | ICD-10-CM | POA: Diagnosis not present

## 2024-07-01 DIAGNOSIS — I10 Essential (primary) hypertension: Secondary | ICD-10-CM

## 2024-07-01 MED ORDER — ATENOLOL 25 MG PO TABS: 25.0000 mg | ORAL_TABLET | Freq: Every day | ORAL | 1 refills | Status: DC

## 2024-07-01 MED ORDER — HYDROCHLOROTHIAZIDE 25 MG PO TABS: 25.0000 mg | ORAL_TABLET | Freq: Every day | ORAL | 1 refills | Status: DC

## 2024-07-01 NOTE — Progress Notes (Signed)
 Established Patient Office Visit  Subjective    Patient ID: Debbie Ball, female    DOB: Apr 26, 1966  Age: 58 y.o. MRN: [id number]  CC:  Chief Complaint  Patient presents with   Medical Management of Chronic Issues    Pt reports she has been having sciatic pain. Reports swelling in fingers and feet    HPI Debbie Ball presents for follow up of hypertension. Patient also reports some nerve pain (sciatica) in her legs which she has had management per ortho.   Outpatient Encounter Medications as of 07/01/2024  Medication Sig   atenolol  (TENORMIN ) 25 MG tablet Take 25 mg by mouth daily.   meloxicam  (MOBIC ) 7.5 MG tablet Take 1 tablet (7.5 mg total) by mouth daily.   [DISCONTINUED] hydrochlorothiazide  (HYDRODIURIL ) 25 MG tablet Take 1 tablet (25 mg total) by mouth daily.   acetaminophen  (TYLENOL ) 500 MG tablet Take 500 mg by mouth every 6 (six) hours as needed for mild pain. (Patient not taking: Reported on 05/07/2024)   atenolol  (TENORMIN ) 25 MG tablet Take 1 tablet (25 mg total) by mouth daily.   cetirizine  (ZYRTEC  ALLERGY) 10 MG tablet Take 1 tablet (10 mg total) by mouth at bedtime. (Patient not taking: Reported on 07/01/2024)   erythromycin  ophthalmic ointment Place a 1/2 inch ribbon of ointment into the lower eyelid 4 times daily for 7 days.   fluticasone  (FLONASE ) 50 MCG/ACT nasal spray Place 2 sprays into both nostrils daily.   hydrochlorothiazide  (HYDRODIURIL ) 25 MG tablet Take 1 tablet (25 mg total) by mouth daily.   ipratropium (ATROVENT ) 0.06 % nasal spray Place 2 sprays into both nostrils 4 (four) times daily.   methocarbamol  (ROBAXIN ) 500 MG tablet Take 1 tablet (500 mg total) by mouth 3 (three) times daily as needed for muscle spasms.   methylPREDNISolone  (MEDROL  DOSEPAK) 4 MG TBPK tablet Use as directed (Patient not taking: Reported on 05/07/2024)   Multiple Vitamins-Minerals (MULTIVITAMIN WITH MINERALS) tablet Take 1 tablet by mouth daily.   polyethylene glycol powder  (MIRALAX ) 17 GM/SCOOP powder Take 1 Container by mouth daily.   Psyllium (FIBER) 28.3 % POWD as directed Orally   traMADol  (ULTRAM ) 50 MG tablet Take 1 tablet (50 mg total) by mouth every 8 (eight) hours as needed.   [DISCONTINUED] atenolol  (TENORMIN ) 25 MG tablet Take 1 tablet (25 mg total) by mouth daily.   No facility-administered encounter medications on file as of 07/01/2024.    Past Medical History:  Diagnosis Date   GERD (gastroesophageal reflux disease)    Hypertension    Tobacco abuse     Past Surgical History:  Procedure Laterality Date   INDUCED ABORTION      Family History  Problem Relation Age of Onset   Hypertension Father    Hypertension Sister    Other Neg Hx     Social History   Socioeconomic History   Marital status: Single    Spouse name: Not on file   Number of children: Not on file   Years of education: Not on file   Highest education level: Associate degree: occupational, Scientist, product/process development, or vocational program  Occupational History   Not on file  Tobacco Use   Smoking status: Every Day    Current packs/day: 0.50    Average packs/day: 0.5 packs/day for 15.0 years (7.5 ttl pk-yrs)    Types: Cigarettes   Smokeless tobacco: Never  Substance and Sexual Activity   Alcohol use: Yes    Comment: Socially    Drug use:  No   Sexual activity: Yes  Other Topics Concern   Not on file  Social History Narrative   Not on file   Social Drivers of Health   Financial Resource Strain: Medium Risk (06/30/2024)   Overall Financial Resource Strain (CARDIA)    Difficulty of Paying Living Expenses: Somewhat hard  Food Insecurity: Food Insecurity Present (06/30/2024)   Hunger Vital Sign    Worried About Running Out of Food in the Last Year: Sometimes true    Ran Out of Food in the Last Year: Never true  Transportation Needs: No Transportation Needs (06/30/2024)   PRAPARE - Administrator, Civil Service (Medical): No    Lack of Transportation (Non-Medical): No   Physical Activity: Insufficiently Active (06/30/2024)   Exercise Vital Sign    Days of Exercise per Week: 2 days    Minutes of Exercise per Session: 20 min  Stress: Stress Concern Present (06/30/2024)   [employer] of Occupational Health - Occupational Stress Questionnaire    Feeling of Stress: Rather much  Social Connections: Moderately Integrated (06/30/2024)   Social Connection and Isolation Panel    Frequency of Communication with Friends and Family: Once a week    Frequency of Social Gatherings with Friends and Family: Once a week    Attends Religious Services: More than 4 times per year    Active Member of [employer] or Organizations: Yes    Attends Engineer, structural: More than 4 times per year    Marital Status: Living with partner  Intimate Partner Violence: Not on file    Review of Systems  All other systems reviewed and are negative.       Objective    BP 136/87   Pulse 93   Ht 5' 6 (1.676 m)   Wt 216 lb (98 kg)   LMP 03/17/2020   SpO2 96%   BMI 34.86 kg/m   Physical Exam Vitals and nursing note reviewed.  Constitutional:      General: She is not in acute distress. Cardiovascular:     Rate and Rhythm: Normal rate and regular rhythm.  Pulmonary:     Effort: Pulmonary effort is normal.     Breath sounds: Normal breath sounds.  Abdominal:     Palpations: Abdomen is soft.     Tenderness: There is no abdominal tenderness.  Neurological:     General: No focal deficit present.     Mental Status: She is alert and oriented to person, place, and time.         Assessment & Plan:  1. Essential hypertension (Primary) Appears stable. Continue   2. Sciatica, unspecified laterality Improved. Management as per consultant.   3. Tobacco abuse Discussed reduction/cessation    Return in about 6 months (around 01/01/2025).   Tanda Raguel SQUIBB, MD

## 2024-07-13 ENCOUNTER — Encounter: Admitting: Physical Medicine and Rehabilitation

## 2024-07-15 ENCOUNTER — Encounter: Admitting: Physical Medicine and Rehabilitation

## 2024-10-20 ENCOUNTER — Other Ambulatory Visit: Payer: Self-pay

## 2024-10-25 ENCOUNTER — Encounter: Payer: Self-pay | Admitting: Radiology

## 2024-10-26 MED ORDER — ATENOLOL 25 MG PO TABS: 25.0000 mg | ORAL_TABLET | Freq: Every day | ORAL | 0 refills | Status: AC

## 2024-10-26 MED ORDER — HYDROCHLOROTHIAZIDE 25 MG PO TABS: 25.0000 mg | ORAL_TABLET | Freq: Every day | ORAL | 0 refills | Status: AC

## 2024-12-17 ENCOUNTER — Ambulatory Visit: Payer: Self-pay

## 2024-12-17 NOTE — Telephone Encounter (Signed)
 FYI Only or Action Required?: FYI only for provider: Home Care.  Patient was last seen in primary care on 07/01/2024 by Tanda Bleacher, MD.  Called Nurse Triage reporting Cough.  Symptoms began several days ago.  Interventions attempted: OTC medications: Tylenol , Robitussin and Rest, hydration, or home remedies.  Symptoms are: gradually improving.  Triage Disposition: Home Care  Patient/caregiver understands and will follow disposition?: Yes       Reason for Disposition  Cough  Answer Assessment - Initial Assessment Questions 1. ONSET: When did the cough begin?      Christmas Eve, 12/24  2. SEVERITY: How bad is the cough today?      Feeling a little better today, trying to move around more.   3. SPUTUM: Describe the color of your sputum (e.g., none, dry cough; clear, white, yellow, green)     Little bit the other day- yellow, thick; none today.  Has also had runny nose.   4. HEMOPTYSIS: Are you coughing up any blood? If Yes, ask: How much? (e.g., flecks, streaks, tablespoons, etc.)     Denies  5. DIFFICULTY BREATHING: Are you having difficulty breathing? If Yes, ask: How bad is it? (e.g., mild, moderate, severe)      Denies  6. FEVER: Do you have a fever? If Yes, ask: What is your temperature, how was it measured, and when did it start?     Chills on Wednesday, none today;  taking Ibuprofen  and Tylenol  along with Robitussin. Feels medications are helping.   Today: Robitussin this AM 8:30 AM;  took Tylenol  Extra Strength with food; nothing this afternoon; no thermometer available, no flushing/ chills right now  7. CARDIAC HISTORY: Do you have any history of heart disease? (e.g., heart attack, congestive heart failure)      Denies; history of hypertension- denies headaches or vision changes  8. LUNG HISTORY: Do you have any history of lung disease?  (e.g., pulmonary embolus, asthma, emphysema)     Denies  9. PE RISK FACTORS: Do you have a history  of blood clots? (or: recent major surgery, recent prolonged travel, bedridden)     Denies  10. OTHER SYMPTOMS: Do you have any other symptoms? (e.g., runny nose, wheezing, chest pain)       Feeling generalized weakness but able to move around today, no falls or dizziness. Pain in stomach from all the coughing, goes away when not coughing. Not as much body aches today as other day.  Reports feeling generally improved today. Did have sinus pressure headache early on but none today. No ear pain or sinus pain.   12. TRAVEL: Have you traveled out of the country in the last month? (e.g., travel history, exposures)       Denies travel; works in [employer]  Protocols used: Cough - Acute Productive-A-AH

## 2024-12-17 NOTE — Telephone Encounter (Signed)
 Attempted to contact patient x 1 to discuss symptoms; LVM to return call, Will attempt to contact patient at a later time to further discuss concerns.          Copied from CRM [id number]. Topic: Clinical - Red Word Triage >> Dec 17, 2024 10:18 AM Jeoffrey H wrote: [facility] that prompted transfer to Nurse Triage: patient c/o weakness, cough that causes stomach pain, and running nose. Denied SOB or Chest pain. >> Dec 17, 2024 10:24 AM Jeoffrey H wrote: Originally sent as pink word due to no response from chat. Was then advised to send as Red Word. Called patient back 2x with no answer. Please call patient back at [phone]

## 2024-12-30 ENCOUNTER — Ambulatory Visit: Admitting: Family Medicine

## 2024-12-30 ENCOUNTER — Telehealth: Payer: Self-pay | Admitting: Family Medicine

## 2024-12-30 NOTE — Telephone Encounter (Signed)
 Called pt and left vm to call office back to reschedule missed appt
